# Patient Record
Sex: Male | Born: 1985 | Race: Asian | Hispanic: No
Health system: Western US, Academic
[De-identification: ages and names within clinical notes are randomized; demographics above are authoritative.]

## PROBLEM LIST (undated history)

## (undated) HISTORY — PX: PR RENAL ALTRNSPLJ IMPLTJ GRF W/O RCP NEPHRECTOMY: 50360

## (undated) MED ORDER — MYCOPHENOLATE MOFETIL 500 MG OR TABS
ORAL_TABLET | ORAL | 2 refills | Status: AC
Start: 2017-02-17 — End: ?

## (undated) MED ORDER — LISINOPRIL 5 MG OR TABS
ORAL_TABLET | ORAL | 0 refills | Status: AC
Start: 2016-02-26 — End: ?

---

## 2012-11-11 DIAGNOSIS — Z940 Kidney transplant status: Secondary | ICD-10-CM | POA: Insufficient documentation

## 2012-11-11 HISTORY — DX: Kidney transplant status: Z94.0

## 2012-11-15 DIAGNOSIS — I10 Essential (primary) hypertension: Secondary | ICD-10-CM

## 2012-11-15 HISTORY — DX: Essential (primary) hypertension: I10

## 2013-02-18 DIAGNOSIS — D849 Immunodeficiency, unspecified: Secondary | ICD-10-CM | POA: Insufficient documentation

## 2013-04-27 ENCOUNTER — Other Ambulatory Visit (HOSPITAL_COMMUNITY): Payer: Self-pay

## 2013-07-21 DIAGNOSIS — B002 Herpesviral gingivostomatitis and pharyngotonsillitis: Secondary | ICD-10-CM | POA: Insufficient documentation

## 2013-08-27 ENCOUNTER — Telehealth (HOSPITAL_BASED_OUTPATIENT_CLINIC_OR_DEPARTMENT_OTHER): Payer: Self-pay

## 2013-08-27 NOTE — Telephone Encounter (Signed)
CONFIRMED PHONE NUMBER: [phone]  CALLERS FIRST AND LAST NAME: Ernst BreachStephanie Larson  FACILITY NAME: [facility] TITLE: Procedure Scheduler Nurse  CALLERS RELATIONSHIP:OTHER: referring clinic  RETURN CALL: Detailed message on voicemail only     SUBJECT: General Message   REASON FOR REQUEST: Referral    MESSAGE: Judeth CornfieldStephanie is calling because they sent over a referral on 7/23 and wanted to get a status update on that. Please call them back, thank you.

## 2013-08-31 NOTE — Telephone Encounter (Signed)
Judeth CornfieldStephanie calling again regarding scheduling, per [facility] [place] He needs to be seen asap  Thanks call her [phone]

## 2013-09-10 ENCOUNTER — Other Ambulatory Visit (HOSPITAL_BASED_OUTPATIENT_CLINIC_OR_DEPARTMENT_OTHER): Payer: Self-pay | Admitting: Nephrology

## 2013-09-10 DIAGNOSIS — Z940 Kidney transplant status: Secondary | ICD-10-CM

## 2013-09-22 ENCOUNTER — Encounter (HOSPITAL_BASED_OUTPATIENT_CLINIC_OR_DEPARTMENT_OTHER): Payer: Self-pay | Admitting: Nephrology

## 2013-09-22 ENCOUNTER — Ambulatory Visit: Payer: 59 | Attending: Nephrology | Admitting: Nephrology

## 2013-09-22 ENCOUNTER — Other Ambulatory Visit (HOSPITAL_BASED_OUTPATIENT_CLINIC_OR_DEPARTMENT_OTHER): Payer: Self-pay | Admitting: Nephrology

## 2013-09-22 VITALS — BP 136/89 | HR 80 | Temp 97.9°F | Ht 66.0 in | Wt 136.0 lb

## 2013-09-22 DIAGNOSIS — R7989 Other specified abnormal findings of blood chemistry: Secondary | ICD-10-CM | POA: Insufficient documentation

## 2013-09-22 DIAGNOSIS — Z940 Kidney transplant status: Secondary | ICD-10-CM | POA: Insufficient documentation

## 2013-09-22 DIAGNOSIS — N185 Chronic kidney disease, stage 5: Secondary | ICD-10-CM

## 2013-09-22 DIAGNOSIS — R768 Other specified abnormal immunological findings in serum: Secondary | ICD-10-CM | POA: Insufficient documentation

## 2013-09-22 DIAGNOSIS — Z48298 Encounter for aftercare following other organ transplant: Secondary | ICD-10-CM | POA: Insufficient documentation

## 2013-09-22 DIAGNOSIS — R911 Solitary pulmonary nodule: Secondary | ICD-10-CM | POA: Insufficient documentation

## 2013-09-22 HISTORY — DX: Chronic kidney disease, stage 5: N18.5

## 2013-09-22 LAB — CBC, DIFF
% Basophils: 0 %
% Eosinophils: 2 %
% Immature Granulocytes: 1 %
% Lymphocytes: 19 %
% Monocytes: 7 %
% Neutrophils: 71 %
Absolute Eosinophil Count: 0.1 10*3/uL (ref 0.00–0.50)
Absolute Lymphocyte Count: 1.14 10*3/uL (ref 1.00–4.80)
Basophils: 0.01 10*3/uL (ref 0.00–0.20)
Hematocrit: 40 % (ref 38–50)
Hemoglobin: 13.2 g/dL (ref 13.0–18.0)
Immature Granulocytes: 0.03 10*3/uL (ref 0.00–0.05)
MCH: 29.2 pg (ref 27.3–33.6)
MCHC: 33.3 g/dL (ref 32.2–36.5)
MCV: 88 fL (ref 81–98)
Monocytes: 0.43 10*3/uL (ref 0.00–0.80)
Neutrophils: 4.23 10*3/uL (ref 1.80–7.00)
Platelet Count: 206 10*3/uL (ref 150–400)
RBC: 4.52 mil/uL (ref 4.40–5.60)
RDW-CV: 12.8 % (ref 11.6–14.4)
WBC: 5.94 10*3/uL (ref 4.3–10.0)

## 2013-09-22 LAB — LIPID PANEL
Cholesterol (LDL): 91 mg/dL (ref <130)
Cholesterol/HDL Ratio: 4.6
HDL Cholesterol: 37 mg/dL — ABNORMAL LOW (ref >39)
Non-HDL Cholesterol: 134 mg/dL (ref 0–159)
Total Cholesterol: 171 mg/dL (ref <200)
Triglyceride: 214 mg/dL — ABNORMAL HIGH (ref <150)

## 2013-09-22 LAB — URINALYSIS COMPLETE, URN
Bacteria, URN: NONE SEEN
Bilirubin (Qual), URN: NEGATIVE
Epith Cells_Renal/Trans,URN: NEGATIVE /HPF
Epith Cells_Squamous, URN: NEGATIVE /LPF
Glucose Qual, URN: NEGATIVE mg/dL
Ketones, URN: NEGATIVE mg/dL
Leukocyte Esterase, URN: NEGATIVE
Nitrite, URN: NEGATIVE
Occult Blood, URN: NEGATIVE
Protein (Alb Semiquant), URN: NEGATIVE mg/dL
RBC, URN: NEGATIVE /HPF
Specific Gravity, URN: 1.012 g/mL (ref 1.002–1.027)
WBC, URN: NEGATIVE /HPF

## 2013-09-22 LAB — CYCLOSPORINE BY LCMSMS: Cyclosporine By Lc_Ms: 101 ng/mL

## 2013-09-22 LAB — COMPREHENSIVE METABOLIC PANEL
ALT (GPT): 23 U/L (ref 10–64)
AST (GOT): 21 U/L (ref 9–38)
Albumin: 4.7 g/dL (ref 3.5–5.2)
Alkaline Phosphatase (Total): 95 U/L (ref 35–109)
Anion Gap: 7 (ref 4–12)
Bilirubin (Total): 1 mg/dL (ref 0.2–1.3)
Calcium: 9.5 mg/dL (ref 8.9–10.2)
Carbon Dioxide, Total: 26 mEq/L (ref 22–32)
Chloride: 101 mEq/L (ref 98–108)
Creatinine: 1.62 mg/dL — ABNORMAL HIGH (ref 0.51–1.18)
GFR, Calc, African American: >60 mL/min (ref >59)
GFR, Calc, European American: 52 mL/min — ABNORMAL LOW (ref >59)
Glucose: 108 mg/dL (ref 62–125)
Potassium: 3.7 mEq/L (ref 3.6–5.2)
Protein (Total): 7.5 g/dL (ref 6.0–8.2)
Sodium: 134 mEq/L — ABNORMAL LOW (ref 135–145)
Urea Nitrogen: 23 mg/dL — ABNORMAL HIGH (ref 8–21)

## 2013-09-22 LAB — PROTEIN/CREATININE RATIO, TIMED URINE
Creatinine/Unit, Urine: 108 mg/dL
Protein (Total), Urine: 9 mg/dL
Protein/Creatinine Ratio: <0.1 (ref <0.2)

## 2013-09-22 LAB — MAGNESIUM: Magnesium: 1.9 mg/dL (ref 1.8–2.4)

## 2013-09-22 LAB — MYCOPHENOLIC ACID: Mycophenolic Acid: 1.9 ug/mL (ref 1.0–3.5)

## 2013-09-22 LAB — PHOSPHATE: Phosphate: 3.4 mg/dL (ref 2.5–4.5)

## 2013-09-22 LAB — PARATHYROID HORMONE: Parathyroid Hormone: 25 pg/mL (ref 12–88)

## 2013-09-22 MED ORDER — MAGNESIUM OXIDE -MG SUPPLEMENT 400 (240 MG) MG OR TABS
400.0000 mg | ORAL_TABLET | Freq: Two times a day (BID) | ORAL | Status: DC
Start: 2013-09-22 — End: 2013-12-22

## 2013-09-22 MED ORDER — MYCOPHENOLATE MOFETIL 500 MG OR TABS
1000.0000 mg | ORAL_TABLET | Freq: Two times a day (BID) | ORAL | Status: DC
Start: 2013-09-22 — End: 2013-12-21

## 2013-09-22 MED ORDER — FERROUS SULFATE 324 (65 FE) MG OR TBEC
324.0000 mg | DELAYED_RELEASE_TABLET | Freq: Every day | ORAL | Status: DC
Start: 2013-09-22 — End: 2014-03-01

## 2013-09-22 MED ORDER — VITAMIN D (ERGOCALCIFEROL) 1.25 MG (50000 UT) OR CAPS
50000.0000 [IU] | ORAL_CAPSULE | ORAL | Status: DC
Start: 2013-09-22 — End: 2013-11-03

## 2013-09-22 MED ORDER — CYCLOSPORINE 25 MG OR CAPS
100.0000 mg | ORAL_CAPSULE | Freq: Two times a day (BID) | ORAL | Status: DC
Start: 2013-09-22 — End: 2013-12-21

## 2013-09-22 NOTE — Progress Notes (Signed)
Transplant Nephrology Outpatient Initial visit  Encounter Date: 09/22/2013  ACOMMPANIED BY: This patient is accompanied in the office by his sister.     Chief Complaint: 28 year old man with history of LRT 11/11/12, here to transfer care from [place].      History of Present Illness:  Mr. Herbert FreesMohammed is a 28 year old man with ESRD of unclear etiology, now with kidney transplant, here to establish care with a transplant center.    At about the age of 28 or [age], when he first came to [place], he was seen by a doctor for headaches.  He was eventually found to have blood pressures 240s/120s and was admitted for blood pressure control.  He had chronic kidney disease at that time which progressed over years to CKD stage V.  He does not have a primary diagnosis for his kidney disease - serologic workup was negative.  He underwent living related kidney transplant from his brother at [facility] [place] [facility] 11, 2012.    He underwent induction with ATG 5.4mg /kg and was started on an FK/MMF regimen.  An early biopsy 11/27/13 for elevated creatinine did not show any evidence of rejection.  He was eventually changed to cyclosporine / MMF at his request due to hair loss attributed to tacrolimus.  He has continued on this regimen with fluctuating creatinine - sometimes as high as 1.7 or 1.8.  He has not been biopsied since.  He has not had any proteinuria or cells in the urine.  He thinks his baseline creatinine is 1.4-1.6, confirmed by his nephrologist.  His brother is doing well, and he has a creatinine of 1.3.    He had one episode in February of fever without origin.  This required hospitalization.  He recovered.  The etiology remains unclear.    He has also had some difficulties with trouble sleeping and anxiety / bad dreams when falling asleep.  His family is further concerned about a volatile temper, which was not present prior to the transplant.  They wonder if his medications are to blame.     Review of Systems -      Otherwise negative except for noted in the HPI.      Past Medical History  I personally reviewed and confirmed the ROS, past medical history, past surgical history, family history and social history in the record with the patient today.    Patient Active Problem List   Diagnosis   . Immunosuppression   . 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 Herpetic gingivostomatitis   . CMV donor positive, recipient positive   . Lung nodule seen on imaging study        Past Medical History   Diagnosis Date   . Chronic kidney disease, stage V 09/22/2013     CKD and severe hypertension since 2006, unknown etiology. Preemptive kidney transplant 2014    . Hypertension 11/15/2012   . 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 ATG induction 5.4mg /kg (300mg ) Biopsy for increasing creatinine 11/27/12, no rejection. Baseline creatinine 1.4         Past Surgical History   Procedure Laterality Date   . Renal altrnsplj impltj grf w/o rcp nephrectomy          Review of patient's allergies indicates:  Allergies   Allergen Reactions   . Sulfa Antibiotics      Other reaction(s): Unknown Reaction        Current Outpatient Prescriptions  Medication Sig Dispense Refill   . CycloSPORINE 25 MG Oral Cap Take 4 capsules (100 mg) by mouth 2 times a day. 240 capsule 5   . Dimethicone 2 % External Cream Apply 1 application topically 2 times a day.     . Ferrous Sulfate 324 (65 FE) MG Oral Tab EC Take 1 tablet (324 mg) by mouth daily. 30 tablet 5   . Magnesium Oxide 400 (240 MG) MG Oral Tab Take 1 tablet (400 mg) by mouth 2 times a day. 60 tablet 3   . Mycophenolate Mofetil 500 MG Oral Tab Take 2 tablets (1,000 mg) by mouth 2 times a day. 120 tablet 5   . Vitamin D, Ergocalciferol, 50000 UNITS Oral Cap Take 1 capsule (50,000 Units) by mouth every 2 weeks. 6 capsule 3     No current facility-administered medications for this visit.        Medication Notes:   I have reviewed the above list of  medicines with the patient and it is accurate.     Family History   Problem Relation Age of Onset   . Diabetes Father    . Heart Disease No Hx Of    . Cancer No Hx Of    . Renal Disease No Hx Of    . Diabetes Paternal Grandmother         History     Social History   . Marital Status: Single     Spouse Name: N/A     Number of Children: N/A   . Years of Education: N/A     Occupational History   . Not on file.     Social History Main Topics   . Smoking status: Not on file   . Smokeless tobacco: Not on file   . Alcohol Use: Not on file   . Drug Use: Not on file   . Sexual Activity: Not on file     Other Topics Concern   . Not on file     Social History Narrative    Moved from [place] to [place] in 2015 after completing his studies.  Lives with sister and brother.        09/22/2013  BP 136/89 mmHg  Pulse 80  Temp(Src) 97.9 F (36.6 C) (Temporal)  Ht 5\' 6"  (1.676 m)  Wt 136 lb 0.4 oz (61.7 kg)  BMI 21.97 kg/m2  SpO2 99%  Estimated body mass index is 21.97 kg/(m^2) as calculated from the following:    Height as of this encounter: 5\' 6"  (1.676 m).    Weight as of this encounter: 136 lb 0.4 oz (61.7 kg).     Physical Exam:   Vital signs as above.    GENERAL: well-appearing young man, in no distress  Head: really great head of hair  Eye: perrl, anicteric sclerae  ENT: mmm, no oral lesions  CV: rrr, no m/r/g, no edema, 2+ radial and DP pulses  PUlm: clear to auscultation bilaterally  abd: soft, nontender, nondistended, no masses  GU: right lower quadrant well-healed scar, nontender, no bruit  Skin: no rashes  Neuro: no tremor        RESULTS REVIEWED.  Orders Only on 09/22/13   1. PHOSPHATE   Result Value Ref Range    Phosphate 3.4 2.5 - 4.5 mg/dL   2. MAGNESIUM   Result Value Ref Range    Magnesium 1.9 1.8 - 2.4 mg/dL   3. COMPREHENSIVE METABOLIC PANEL   Result Value Ref Range  Sodium 134 (L) 135 - 145 mEq/L    Potassium 3.7 3.6 - 5.2 mEq/L    Chloride 101 98 - 108 mEq/L    Carbon Dioxide, Total 26 22 - 32 mEq/L     Anion Gap 7 4 - 12    Glucose 108 62 - 125 mg/dL    Urea Nitrogen 23 (H) 8 - 21 mg/dL    Creatinine 1.61 (H) 0.51 - 1.18 mg/dL    Protein (Total) 7.5 6.0 - 8.2 g/dL    Albumin 4.7 3.5 - 5.2 g/dL    Bilirubin (Total) 1.0 0.2 - 1.3 mg/dL    Calcium 9.5 8.9 - 09.6 mg/dL    AST (GOT) 21 9 - 38 U/L    Alkaline Phosphatase (Total) 95 35 - 109 U/L    ALT (GPT) 23 10 - 64 U/L    GFR, Calc, European American 52 (L) >59 mL/min    GFR, Calc, African American >60 >59 mL/min    GFR, Information       Calculated GFR in mL/min/1.73 m2 by MDRD equation.  Inaccurate with changing renal function.  See [email or web address]   4. CBC, DIFF   Result Value Ref Range    WBC 5.94 4.3 - 10.0 THOU/uL    RBC 4.52 4.40 - 5.60 mil/uL    Hemoglobin 13.2 13.0 - 18.0 g/dL    Hematocrit 40 38 - 50 %    MCV 88 81 - 98 fL    MCH 29.2 27.3 - 33.6 pg    MCHC 33.3 32.2 - 36.5 g/dL    Platelet Count 045 409 - 400 THOU/uL    RDW-CV 12.8 11.6 - 14.4 %    % Neutrophils 71 %    % Lymphocytes 19 %    % Monocytes 7 %    % Eosinophils 2 %    % Basophils 0 %    % Immature Granulocytes 1 %    Neutrophils 4.23 1.80 - 7.00 THOU/uL    Absolute Lymphocyte Count 1.14 1.00 - 4.80 THOU/uL    Monocytes 0.43 0.00 - 0.80 THOU/uL    Absolute Eosinophil Count 0.10 0.00 - 0.50 THOU/uL    Basophils 0.01 0.00 - 0.20 THOU/uL    Immature Granulocytes 0.03 0.00 - 0.05 THOU/uL   5. Urinalysis Complete, URN   Result Value Ref Range    Color, URN Yellow     Clarity, URN Clear     Specific Gravity, URN 1.012 1.002 - 1.027 g/mL    pH, URN < or =5.0 5.0 - 8.0    Protein (Alb Semiquant), URN Negative NRN mg/dL    Glucose Qual, URN Negative NRN mg/dL    Ketones, URN Negative NRN mg/dL    Bilirubin (Qual), URN Negative NRN    Occult Blood, URN Negative NRN    Nitrite, URN Negative NRN    Leukocyte Esterase, URN Negative NRN    Urobilinogen, URN 0.1-1.9 URONML EHRLICH UNITS    Comments for Macroscopic, URN None     WBC, URN 0-5(NEG) Z5NEG /HPF    RBC, URN  0-2(NEG) Z2NEG /HPF    Bacteria, URN Not Seen NOSEEN    Epith Cells_Squamous, URN 0-5(NEG) LT6 /LPF    Epith Cells_Renal/Trans,URN <3(NEG) LESS3 /HPF    Comments For Microscopic, URN None NONE   6. URINE PROTEIN/CREATININE RATIO   Result Value Ref Range    Protein/Creat Ratio Int, URN Random hr    Protein/Creat Ratio Volume,URN Random mL    Protein (Total), URN 9  mg/dL    Total ZOXWRUE/45W, URN Not calculated 0.050 - 0.080 g/24 hr    Creatinine/Unit, URN 108 mg/dL    UJWJXBJYNW/29F, URN Not calculated 1000 - 2000 mg/24 hr    Protein/Creatinine Ratio <0.1 <0.2   7. LIPID PANEL   Result Value Ref Range    Cholesterol (Total) 171 <200 mg/dL    Triglyceride 621 (H) <150 mg/dL    Cholesterol (HDL) 37 (L) >39 mg/dL    Cholesterol (LDL) 91 <130 mg/dL    Non-HDL Cholesterol 134 0 - 159 mg/dL    Cholesterol/HDL Ratio 4.6     Lipid Panel, Additional Info. (NOTE)    8. MYCOPHENOLIC ACID   Result Value Ref Range    Mycophenolic Acid 1.9 1.0 - 3.5 mcg/mL   9. PARATHYROID HORMONE   Result Value Ref Range    Parathyroid Hormone 25 12 - 88 pg/mL   10. CYCLOSPORINE BY LCMSMS   Result Value Ref Range    Cyclosporine By Lc_Ms 101 ng/mL    Cyclosporine Information Whole blood Cyclosporine was measured by LCMSMS.          Impression and Plan:  Herbert Robertson is a 28 year old man with ESRD of unknown etiology, now s/p LRT 11/11/12 from his brother.      He presents today for transfer of care.  Creatinine is 1.6 without proteinuria, which appears to be his recent baseline (with a fair amount of fluctuation).    He is maintained on a steroid-free regimen of cyclosporine / MMF.    I am concerned about his level of immunosuppression, and wonder whether other options (tacrolimus, sirolimus, belatacept) might be better as a cornerstone of treatment rather than cyclosporine.  I do not know the degree of matching between him and his brother, though I do note that relatively high induction immunosuppression was used.  We will try to find out the  degree of haplotype match, which might influence the amount of immunosuppression.    Given his elevated creatinine and current regimen, I favor a surveillance biopsy at around one-year post-transplant and then a re-evaluation of his long-term immunosuppression plan.    1. Renal allograft function  - baseline creatinine 1.5-1.6 without proteinuria    2. Immunosuppression.  ATG 5.6mg /kg induction.  Steroid-free regimen.  - increase cyclosporine 75mg  bid to 100mg  bid  - continue MMF 1g bid  - off tacrolimus (hair loss)    3. HTN: resolved  - on no medications    4. Hypomagnesemia  - continue supplementation    Labs 2 weeks given CSA change.  RTC one month, plan for biopsy at that time  Will refer to outside nephrologist on the [facility] after biopsy and immunosuppression plan determined.

## 2013-09-23 ENCOUNTER — Other Ambulatory Visit (HOSPITAL_BASED_OUTPATIENT_CLINIC_OR_DEPARTMENT_OTHER): Payer: Self-pay

## 2013-09-23 DIAGNOSIS — Z940 Kidney transplant status: Secondary | ICD-10-CM

## 2013-09-24 LAB — VITAMIN D (25 HYDROXY)
Vit D (25_Hydroxy) Total: 42.2 ng/mL (ref 20.1–50.0)
Vitamin D2 (25_Hydroxy): 33.7 ng/mL
Vitamin D3 (25_Hydroxy): 8.5 ng/mL

## 2013-09-24 LAB — BK VIRUS PCR QUANT, URINE: BK Virus Urine Quant: NOT DETECTED Copies/mL

## 2013-10-08 ENCOUNTER — Other Ambulatory Visit (HOSPITAL_COMMUNITY): Payer: Self-pay

## 2013-10-08 LAB — UA MACROSCOPIC W/REFLEX CULTURE, EXTERNAL
Bilirubin Qual, URN, External: NEGATIVE
Glucose Qual, URN, External: NEGATIVE
Ketones, URN, External: NEGATIVE
Leukocyte Esterase, URN, External: NEGATIVE
Nitrite, URN, External: NEGATIVE
Occult Blood, URN, External: NEGATIVE
Protein (Alb Semi Quant), URN, External: NEGATIVE
Specific Gravity, URN, External: 1.013
Urobilinogen, URN, External: 0.2
pH, URN, External: 6

## 2013-10-08 LAB — HEPATIC FUNCTION PANEL, EXTERNAL
ALT (GPT), External: 10
AST (GOT), External: 17
Albumin, External: 4.7
Alkaline Phosphatase (Total), External: 119
Bilirubin (Total), External: 0.5
Protein (Total), External: 7.1

## 2013-10-08 LAB — CBC, DIFF, EXTERNAL
% Basophils, External: 0
% Eosinophils, External: 1
% Lymphocytes, External: 29
% Monocytes, External: 8
% Neutrophils, External: 62
Basophils, External: 0
Eosinophil Count, External: 0.1
Lymphocyte Count, External: 2
Monocytes, External: 0.5
Neutrophils, External: 4.1

## 2013-10-08 LAB — CBC (HEMOGRAM), EXTERNAL
Hematocrit, External: 41.4
Hemoglobin, External: 13.5
MCH, External: 28.9
MCV, External: 89
Platelet Count, External: 203
WBC, External: 6.8

## 2013-10-08 LAB — UA MICROSCOPIC W/REFLEX CULTURE, EXTERNAL: RBC, URN, External: NONE SEEN

## 2013-10-08 LAB — PROTEIN/CREATININE RATIO, URN, EXTERNAL
Creatinine/Unit, URN, External: 85.3
Protein (Total), URN, External: 5.9
Protein/Creatinine Ratio, External: 69

## 2013-10-08 LAB — BASIC METABOLIC PANEL, EXTERNAL
Calcium, External: 9.5
Carbon Dioxide (Total), External: 19
Chloride, External: 99
Creatinine, External: 1.74
Glucose, External: 105
Potassium, External: 4.5
Sodium, External: 139
Urea Nitrogen, External: 22

## 2013-10-08 LAB — PHOSPHATE, EXTERNAL: Phosphate, External: 5.2

## 2013-10-08 LAB — CYCLOSPORINE, EXTERNAL: Cyclosporine By LC_MS, External: 153

## 2013-10-08 LAB — COMPREHENSIVE METABOLIC PANEL, EXTERNAL: GFR, Calc, European American, External: 53

## 2013-10-08 LAB — MAGNESIUM, EXTERNAL: Magnesium, External: 2

## 2013-10-09 ENCOUNTER — Other Ambulatory Visit: Payer: Self-pay

## 2013-10-20 ENCOUNTER — Encounter (HOSPITAL_BASED_OUTPATIENT_CLINIC_OR_DEPARTMENT_OTHER): Payer: 59 | Admitting: Nephrology

## 2013-11-03 ENCOUNTER — Encounter (HOSPITAL_BASED_OUTPATIENT_CLINIC_OR_DEPARTMENT_OTHER): Payer: Self-pay | Admitting: Nephrology

## 2013-11-03 ENCOUNTER — Ambulatory Visit: Payer: 59 | Attending: Nephrology | Admitting: Nephrology

## 2013-11-03 ENCOUNTER — Other Ambulatory Visit (HOSPITAL_BASED_OUTPATIENT_CLINIC_OR_DEPARTMENT_OTHER): Payer: Self-pay

## 2013-11-03 ENCOUNTER — Other Ambulatory Visit (HOSPITAL_BASED_OUTPATIENT_CLINIC_OR_DEPARTMENT_OTHER): Payer: Self-pay | Admitting: Nephrology

## 2013-11-03 ENCOUNTER — Other Ambulatory Visit (HOSPITAL_COMMUNITY): Payer: Self-pay

## 2013-11-03 VITALS — BP 140/93 | HR 78 | Temp 98.1°F | Ht 66.0 in | Wt 139.6 lb

## 2013-11-03 DIAGNOSIS — Z940 Kidney transplant status: Secondary | ICD-10-CM

## 2013-11-03 DIAGNOSIS — D849 Immunodeficiency, unspecified: Secondary | ICD-10-CM

## 2013-11-03 DIAGNOSIS — Z23 Encounter for immunization: Secondary | ICD-10-CM

## 2013-11-03 DIAGNOSIS — D899 Disorder involving the immune mechanism, unspecified: Secondary | ICD-10-CM | POA: Insufficient documentation

## 2013-11-03 DIAGNOSIS — I10 Essential (primary) hypertension: Secondary | ICD-10-CM | POA: Insufficient documentation

## 2013-11-03 LAB — COMPREHENSIVE METABOLIC PANEL
ALT (GPT): 17 U/L (ref 10–64)
AST (GOT): 20 U/L (ref 9–38)
Albumin: 4.5 g/dL (ref 3.5–5.2)
Alkaline Phosphatase (Total): 93 U/L (ref 35–109)
Anion Gap: 8 (ref 4–12)
Bilirubin (Total): 0.6 mg/dL (ref 0.2–1.3)
Calcium: 9.5 mg/dL (ref 8.9–10.2)
Carbon Dioxide, Total: 24 mEq/L (ref 22–32)
Chloride: 103 mEq/L (ref 98–108)
Creatinine: 1.64 mg/dL — ABNORMAL HIGH (ref 0.51–1.18)
GFR, Calc, African American: >60 mL/min (ref >59)
GFR, Calc, European American: 51 mL/min — ABNORMAL LOW (ref >59)
Glucose: 107 mg/dL (ref 62–125)
Potassium: 4 mEq/L (ref 3.6–5.2)
Protein (Total): 7.4 g/dL (ref 6.0–8.2)
Sodium: 135 mEq/L (ref 135–145)
Urea Nitrogen: 19 mg/dL (ref 8–21)

## 2013-11-03 LAB — CBC, DIFF
% Basophils: 0 %
% Eosinophils: 2 %
% Immature Granulocytes: 0 %
% Lymphocytes: 24 %
% Monocytes: 8 %
% Neutrophils: 66 %
Absolute Eosinophil Count: 0.09 10*3/uL (ref 0.00–0.50)
Absolute Lymphocyte Count: 1.34 10*3/uL (ref 1.00–4.80)
Basophils: 0.02 10*3/uL (ref 0.00–0.20)
Hematocrit: 39 % (ref 38–50)
Hemoglobin: 13.4 g/dL (ref 13.0–18.0)
Immature Granulocytes: 0.01 10*3/uL (ref 0.00–0.05)
MCH: 28.8 pg (ref 27.3–33.6)
MCHC: 34.6 g/dL (ref 32.2–36.5)
MCV: 83 fL (ref 81–98)
Monocytes: 0.43 10*3/uL (ref 0.00–0.80)
Neutrophils: 3.76 10*3/uL (ref 1.80–7.00)
Platelet Count: 172 10*3/uL (ref 150–400)
RBC: 4.66 mil/uL (ref 4.40–5.60)
RDW-CV: 12.3 % (ref 11.6–14.4)
WBC: 5.65 10*3/uL (ref 4.3–10.0)

## 2013-11-03 LAB — URINALYSIS COMPLETE, URN
Bacteria, URN: NONE SEEN
Bilirubin (Qual), URN: NEGATIVE
Epith Cells_Renal/Trans,URN: NEGATIVE /HPF
Epith Cells_Squamous, URN: NEGATIVE /LPF
Glucose Qual, URN: NEGATIVE mg/dL
Ketones, URN: NEGATIVE mg/dL
Leukocyte Esterase, URN: NEGATIVE
Nitrite, URN: NEGATIVE
Occult Blood, URN: NEGATIVE
Protein (Alb Semiquant), URN: NEGATIVE mg/dL
RBC, URN: NEGATIVE /HPF
Specific Gravity, URN: 1.01 g/mL (ref 1.002–1.027)
WBC, URN: NEGATIVE /HPF

## 2013-11-03 LAB — MYCOPHENOLIC ACID: Mycophenolic Acid: 1.9 ug/mL (ref 1.0–3.5)

## 2013-11-03 LAB — LAB UNDEFINED ORCA/EPIC ORDER

## 2013-11-03 LAB — PROTEIN/CREATININE RATIO, TIMED URINE
Creatinine/Unit, Urine: 88 mg/dL
Protein (Total), Urine: 11 mg/dL
Protein/Creatinine Ratio: 0.1 (ref <0.2)

## 2013-11-03 LAB — CYCLOSPORINE BY LCMSMS: Cyclosporine By Lc_Ms: 129 ng/mL

## 2013-11-03 LAB — MAGNESIUM: Magnesium: 1.8 mg/dL (ref 1.8–2.4)

## 2013-11-03 LAB — PHOSPHATE: Phosphate: 3.9 mg/dL (ref 2.5–4.5)

## 2013-11-03 NOTE — Progress Notes (Signed)
Vaccine Screening Questions: [facility]        1. I feel sick today.   NO    1. There is no evidence that acute illness reduces vaccine efficacy or increases vaccine adverse events. However, with moderate or severe illness, all vaccines should be delayed until the illness has improved. Mild illness (e.g., upperrespiratory infection or diarrhea) are NOT contraindications to vaccination. Do not withhold vaccination if a person is taking antibiotic.    2. Are you allergic to eggs, baker's yeast, gelatin, streptomycin, neomycin, formaldehyde,merthiolate / thimerosal? (Please circle all that apply)  NO    2. Please see Vaccine Excipient Table below at end of form.**    3. Are you currently taking "blood thinning medications" such as Warfarin (Coumadin), Enoxaparin (Lovenox, Dalteparin(Fragmin), Fondiparinux(Arixtra), Aspirin, Clopidogrel(Plavix), Prasugrel(Effient), Dipyridamol(Persantine), Ticagrelor(Brilinta), Cilostazol(Pletal), Aggrenox, Rivaroxaban(Xarelto), Dabigatran(Xarelto)  NO    3.  Patients who are currently receiving these medications need to advise their providers so that we may take specific precautions when giving intramuscular vaccines. Administer using a 23-gauge (or smaller) needle and apply pressure to  the vaccine site for 2 minutes after administration. DO NOT RUB the vaccine site. Patients should be counseled on monitoring for signs and symptoms of hematoma formation at the site of the injection.    4. Do you have allergies to medications, food, or any vaccine?   If yes, please list:@ALLERGIES@  YES:  sulfa    4. History of anaphylactic reaction such as hives, wheezing or difficulty breathing, or circulatory collapse or shock (not fainting) from a previous dose of vaccine or vaccine component is a contraindication for further doses. For example, if a person  experiences anaphylaxis after eating eggs, do not administer influenza vaccine, or if a person has anaphylaxis  after eating gelatin, do not administer MMR or varicella vaccine. Local reactions (e.g., a red eye following instillation of ophthalmic solution) are not contraindications. Please see Vaccine Excipient Table enclosed on order form.     5. Have you ever had a severe reaction to any vaccine? If yes, please describe:  NO    5.  History of anaphylactic reaction to a previous dose of vaccine or vaccine component is a contraindication for subsequent  doses. Under normal circumstances, vaccines are deferred when a precaution is present. However, situations may arise  when the benefit outweighs the risk (e.g., community measles outbreak).    6. Are you currently undergoing radiation treatments?  NO    6.  If patient is undergoing radiation therapy, inactivated vaccine is no problem. For live virus vaccines contact patient's PCP prior to administration.    7. Have you had Immune Globulin (Gamma Globulin) or received blood or blood products in the past seven months?  NO    7. Live virus vaccines may need to be deferred, depending on several variables. Please see the ACIP Statement "General Recommendations on Immunization" on page 1 of the PINK BOOK and APPENDIX A for suggested intervals between administration of blood products and live virus vaccines.    8. Have you ever had Guillian-Barre Syndrome, a condition that causes paralysis?  NO    8. If the patient has had this condition, hold the vaccination and call the MD.    9.  Are you pregnant or planning a pregnancy in the next three months?  NO    9. Live virus vaccines are contraindicated prior to and during pregnancy due to the possible risk of virus transmission to the fetus. Instruct patients who are   sexually active in their childbearing years to practice careful contraception for one month  after receiving the MMR, rubella, varicella, or intranasal influenza vaccination.    10. Have you received any vaccinations in the past 4 weeks?  NO    10. You may either administer  two live vaccines on the same day OR separate them by 28 days. Live vaccines must replicate in order for the immune system to mount a response. Separating live vaccine administrations allows for less interference of  viral replication. On the other hand, inactivated vaccines may be given together or at any spacing interval.    11. Are you allergic to latex products? (If yes, please describe reaction):  NO    11. If the patient has a true latex allergy, call the inpatient pharmacy ([phone]) and they will provide a latex-free immunization.          Adapted from "Understanding the Screening Questionnaire for Adult Immunization", Immunization [employer], www.immunize.org.   Updated June 2014    **VACCINE EXCIPIENT AND MEDIA SUMMARY  Excipient Use Vaccine    Egg protein: Influenza (all brands)  Formaldehyde, formalin:  DTaP (all brands), Td (all brands), Hepatitis A (Havrix, Vaqta), Hepatitis A-hepatitis B (Twinrix), Influenza (Fluogen, FluShield, Fluzone)  Gelatin: Influenza (Fluzone), Measles (Attenuvax), Mumps (Mumpsvax),  Rubella (Meruvax II), MMR (MMRII), Typhoid oral (Vivotif), Varicella (Varivax)  Neomycin:  Measles (Attenuvax), Mumps (Mumpsvax), Rubella (Meruvax II),  MMR (MMR-II), Rabies (Imovax), Influenza (Fluvirin)  Phenol: Pneumococcal polysaccharide (Pneumovax 23)  Streptomycin: Influenza (Fluogen)  Thimerosal: Influenza (all brands except the intranasal vaccine), Pneumococcal  polysaccharide (Pneu-Immune 23), Meningococcal (Menomune)  Yeast: Hepatitis B (Engerix-B, Recombivax-HB)    Adapted from "Vaccine Excipient & Media Summary", ImmunoFacts, Facts and Comparisons,  February 2001 and Offit PA, Jew RK. Addressing parents' concerns: do vaccines contain harmful  preservatives, adjuvants, additives, or residuals? Pediatrics.2003;112(6);1394-7.  Updated June 2014

## 2013-11-03 NOTE — Progress Notes (Signed)
Transplant Nephrology Outpatient Initial visit  Encounter Date: 11/03/2013  ACOMMPANIED BY: This patient is accompanied in the office by his sister.     Chief Complaint: 28 year old man with history of LRT 11/11/12 from his brother (Ht 5'4", Wt 170lbs), here for a second opinion to discuss management, including biopsy and immunosuppression.      History of Present Illness:  Mr. Mckone is a 28 year old man with ESRD of unclear etiology, now with kidney transplant. At about the age of 55 or 71, he was seen by a doctor for headaches.  He was found to have blood pressures 240s/120s and was admitted for blood pressure control.  He had chronic kidney disease at that time which progressed over years to CKD stage V.  He does not have a primary diagnosis for his kidney disease - serologic workup was negative.  He underwent living related kidney transplant from his brother at the [facility] November 11, 2012. He received ATG 5.11m/kg and was started on an FK/MMF regimen (steroid-avoidance).  An early biopsy 11/27/12 for elevated creatinine did not show any evidence of rejection, was complicated by bladder clots requiring temporary foley placement.  He was eventually changed to cyclosporine / MMF at his request due to hair loss attributed to tacrolimus.  He has continued on this regimen with fluctuating creatinine - sometimes as high as 1.7 or 1.8, and he thinks his baseline creatinine is 1.4-1.6, confirmed by his nephrologist. He has not been biopsied since.  He has not had any proteinuria or cells in the urine.  His brother is doing well, and he has a creatinine of 1.3.    He has no complaints today, is exercising 4 days/week, is sleeping better and eating high protein diet. He does have difficulty with weight loss. His sister notes that he is much more pleasant since he has stopped playing a shooting video game.    Review of Systems -   Otherwise negative except for noted in the HPI.      Past Medical History  I  personally reviewed and confirmed the ROS, past medical history, past surgical history, family history and social history in the record with the patient today.      Patient Active Problem List   Diagnosis   . Immunosuppression   . 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 Herpetic gingivostomatitis   . CMV donor positive, recipient positive   . Lung nodule seen on imaging study        Past Medical History   Diagnosis Date   . Chronic kidney disease, stage V 09/22/2013     CKD and severe hypertension since 2006, unknown etiology. Preemptive kidney transplant 2014    . Hypertension 11/15/2012   . 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 ATG induction 5.461mkg (30058mBiopsy for increasing creatinine 11/27/12, no rejection. Baseline creatinine 1.4         Past Surgical History   Procedure Laterality Date   . Renal altrnsplj impltj grf w/o rcp nephrectomy          Review of patient's allergies indicates:  Allergies   Allergen Reactions   . Sulfa Antibiotics      Other reaction(s): Unknown Reaction        Current Outpatient Prescriptions   Medication Sig Dispense Refill   . CycloSPORINE 25 MG Oral Cap Take 4 capsules (100 mg) by mouth 2 times a day. 240 capsule 5   .  Dimethicone 2 % External Cream Apply 1 application topically 2 times a day.     . Ferrous Sulfate 324 (65 FE) MG Oral Tab EC Take 1 tablet (324 mg) by mouth daily. 30 tablet 5   . Magnesium Oxide 400 (240 MG) MG Oral Tab Take 1 tablet (400 mg) by mouth 2 times a day. 60 tablet 3   . Mycophenolate Mofetil 500 MG Oral Tab Take 2 tablets (1,000 mg) by mouth 2 times a day. 120 tablet 5   . Vitamin D, Ergocalciferol, 50000 UNITS Oral Cap Take 1 capsule (50,000 Units) by mouth every 2 weeks. 6 capsule 3     No current facility-administered medications for this visit.        Medication Notes:   I have reviewed the above list of medicines with the patient and it is accurate.     Family History   Problem  Relation Age of Onset   . Diabetes Father    . Heart Disease No Hx Of    . Cancer No Hx Of    . Renal Disease No Hx Of    . Diabetes Paternal Grandmother         History     Social History   . Marital Status: Single     Spouse Name: N/A     Number of Children: N/A   . Years of Education: N/A     Occupational History   . Not on file.     Social History Main Topics   . Smoking status: Not on file   . Smokeless tobacco: Not on file   . Alcohol Use: Not on file   . Drug Use: Not on file   . Sexual Activity: Not on file     Other Topics Concern   . Not on file     Social History Narrative    Moved from [place] to [place] in 2015 after completing his studies.  Lives with sister and brother.        Physical Exam:   Filed Vitals:    11/03/13 0931 11/03/13 1103   BP: 147/95 140/93   Pulse: 78    Temp: 98.1 F (36.7 C)    TempSrc: Temporal    Height: 5' 6" (1.676 m)    Weight: 139 lb 8.8 oz (63.299 kg)    SpO2: 99%      Estimated body mass index is 22.53 kg/(m^2) as calculated from the following:    Height as of this encounter: 5' 6" (1.676 m).    Weight as of this encounter: 139 lb 8.8 oz (63.299 kg).   General: 28 year old male in no acute distress.  HEENT: Anicteric sclerae, moist mucosa without erythema or exudate.  NECK: Supple, without cervical or supraclavicular lymphadenopathy or jugular venous distention.   LUNGS: Clear to auscultation, without wheeze or rales.  Cardiovascular: Regular rate or rhthym, without murmur, gallop or rub.  Abdominal: Soft, Nontender, nondistended, normal active bowel sounds. Incision is well healed. Allograft is nontender in Right lower quadrant, and without bruit.  Extremities: No clubbing, cyanosis, or edema.  Neurologic: Grossly normal, without tremor. Alert & oriented x4.  Skin: Normal without rash.          RESULTS REVIEWED.  Orders Only on 11/03/13   1. DSA   Result Value Ref Range    Lab Test Requested DSA     Specimen Type/Description blood     Additional Test Information paper  requisition sent to   lab     Test Request Status Order Processed    Orders Only on 11/03/13   2. PHOSPHATE   Result Value Ref Range    Phosphate 3.9 2.5 - 4.5 mg/dL   3. COMPREHENSIVE METABOLIC PANEL   Result Value Ref Range    Sodium 135 135 - 145 mEq/L    Potassium 4.0 3.6 - 5.2 mEq/L    Chloride 103 98 - 108 mEq/L    Carbon Dioxide, Total 24 22 - 32 mEq/L    Anion Gap 8 4 - 12    Glucose 107 62 - 125 mg/dL    Urea Nitrogen 19 8 - 21 mg/dL    Creatinine 1.64 (H) 0.51 - 1.18 mg/dL    Protein (Total) 7.4 6.0 - 8.2 g/dL    Albumin 4.5 3.5 - 5.2 g/dL    Bilirubin (Total) 0.6 0.2 - 1.3 mg/dL    Calcium 9.5 8.9 - 10.2 mg/dL    AST (GOT) 20 9 - 38 U/L    Alkaline Phosphatase (Total) 93 35 - 109 U/L    ALT (GPT) 17 10 - 64 U/L    GFR, Calc, European American 51 (L) >59 mL/min    GFR, Calc, African American >60 >59 mL/min    GFR, Information       Calculated GFR in mL/min/1.73 m2 by MDRD equation.  Inaccurate with changing renal function.  See [email or web address]   4. MAGNESIUM   Result Value Ref Range    Magnesium 1.8 1.8 - 2.4 mg/dL   5. CBC, DIFF   Result Value Ref Range    WBC 5.65 4.3 - 10.0 THOU/uL    RBC 4.66 4.40 - 5.60 mil/uL    Hemoglobin 13.4 13.0 - 18.0 g/dL    Hematocrit 39 38 - 50 %    MCV 83 81 - 98 fL    MCH 28.8 27.3 - 33.6 pg    MCHC 34.6 32.2 - 36.5 g/dL    Platelet Count 172 150 - 400 THOU/uL    RDW-CV 12.3 11.6 - 14.4 %    % Neutrophils 66 %    % Lymphocytes 24 %    % Monocytes 8 %    % Eosinophils 2 %    % Basophils 0 %    % Immature Granulocytes 0 %    Neutrophils 3.76 1.80 - 7.00 THOU/uL    Absolute Lymphocyte Count 1.34 1.00 - 4.80 THOU/uL    Monocytes 0.43 0.00 - 0.80 THOU/uL    Absolute Eosinophil Count 0.09 0.00 - 0.50 THOU/uL    Basophils 0.02 0.00 - 0.20 THOU/uL    Immature Granulocytes 0.01 0.00 - 0.05 THOU/uL   6. Urinalysis Complete, URN   Result Value Ref Range    Color, URN Yellow     Clarity, URN Clear     Specific Gravity, URN 1.010 1.002 - 1.027 g/mL     pH, URN < or =5.0 5.0 - 8.0    Protein (Alb Semiquant), URN Negative NRN mg/dL    Glucose Qual, URN Negative NRN mg/dL    Ketones, URN Negative NRN mg/dL    Bilirubin (Qual), URN Negative NRN    Occult Blood, URN Negative NRN    Nitrite, URN Negative NRN    Leukocyte Esterase, URN Negative NRN    Urobilinogen, URN 6.1-6.0 URONML EHRLICH UNITS    Comments for Macroscopic, URN None     WBC, URN 0-5(NEG) Z5NEG /HPF    RBC, URN 0-2(NEG) Z2NEG /HPF  Bacteria, URN Not Seen NOSEEN    Epith Cells_Squamous, URN 0-5(NEG) LT6 /LPF    Epith Cells_Renal/Trans,URN <3(NEG) LESS3 /HPF    Comments For Microscopic, URN None NONE   7. URINE PROTEIN/CREATININE RATIO   Result Value Ref Range    Protein/Creat Ratio Int, URN Random hr    Protein/Creat Ratio Volume,URN Random mL    Protein (Total), URN  mg/dL    Total Protein/24H, URN  0.05 - 0.08 g/24 hr    Creatinine/Unit, URN  mg/dL    Creatinine/24H, URN  1000 - 2000 mg/24 hr    Protein/Creatinine Ratio  <0.2         Impression and Plan:  Mr. Rodger is a 28-year-old man with ESRD of unknown etiology, now s/p LRT 11/11/12 from his brother at [facility]    1. Renal allograft function. Baseline creatinine 1.5-1.6 without proteinuria;  - I will attempt to obtain his HLA and PRA prior to deciding on whether a biopsy is indicated. He is concerned about the need for a foley post-biopsy, as he required it because of clots and bladder dysfunction with the prior biopsy, which was early post-transplant on narcotics.    2. Immunosuppression.  ATG 5.6mg/kg induction.  Steroid-free regimen.  - Continuie cyclosporine 100mg bid, with goal 100-150ng/ml at this time until further data is available from transplant.  - continue MMF 1g bid  - off tacrolimus (hair loss)    3. HTN: reportedly normal at home; Will recheck today, advised more frequent exercise, healthy diet and regular sleep.  - on no medications    4. Hypomagnesemia. Normal on current dose.  - continue supplementation    5. Fe  deficiency anemia. Will continue on FeSO4 supplementation and follow CBC.    6. Health maintenance. Will provide influenza vaccination today. Advised attention to health, including cognitive engagement with memory games and books since he is concerned about his memory recall.    Labs q month.  RTC 2 months, with decision about biopsy to occur prior - at the time we receive records from [facility]. He may be moving away from the area in the next 3 months, so I would like to address the concern regarding indication for biopsy prior to his move.    Chris Blosser, MD  Department of Medicine - Division of Nephrology & Transplantation  [facility]  Pager [phone]

## 2013-11-04 LAB — URINE C/S W/GRAM
Colony Count: <100
Culture: NO GROWTH
Direct Exam: NONE SEEN

## 2013-11-05 LAB — BK VIRUS PCR QUANT, URINE: BK Virus Urine Quant: NOT DETECTED Copies/mL

## 2013-11-05 LAB — VITAMIN D (25 HYDROXY)
Vit D (25_Hydroxy) Total: 37.6 ng/mL (ref 20.1–50.0)
Vitamin D2 (25_Hydroxy): 20.4 ng/mL
Vitamin D3 (25_Hydroxy): 17.2 ng/mL

## 2013-11-07 LAB — HLA AB SPEC. MONITOR DSA (SENDOUT)

## 2013-12-03 ENCOUNTER — Other Ambulatory Visit (HOSPITAL_COMMUNITY): Payer: Self-pay

## 2013-12-03 LAB — BASIC METABOLIC PANEL, EXTERNAL
Calcium, External: 9.6
Carbon Dioxide (Total), External: 21
Chloride, External: 98
Creatinine, External: 1.49
Glucose, External: 98
Potassium, External: 4.3
Sodium, External: 136
Urea Nitrogen, External: 20

## 2013-12-03 LAB — CBC (HEMOGRAM), EXTERNAL
Hematocrit, External: 40.6
Hemoglobin, External: 13.7
MCH, External: 28.2
MCV, External: 84
Platelet Count, External: 214
WBC, External: 6.2

## 2013-12-03 LAB — MAGNESIUM, EXTERNAL: Magnesium, External: 1.8

## 2013-12-03 LAB — UA MICROSCOPIC W/REFLEX CULTURE, EXTERNAL: RBC, URN, External: NONE SEEN

## 2013-12-03 LAB — PROTEIN/CREATININE RATIO, URN, EXTERNAL
Creatinine/Unit, URN, External: 50
Protein (Total), URN, External: 4.5
Protein/Creatinine Ratio, External: 90

## 2013-12-03 LAB — CBC, DIFF, EXTERNAL
% Basophils, External: 0
% Eosinophils, External: 2
% Lymphocytes, External: 24
% Monocytes, External: 8
% Neutrophils, External: 66
Basophils, External: 0
Eosinophil Count, External: 0.1
Lymphocyte Count, External: 1.5
Monocytes, External: 0.5
Neutrophils, External: 4

## 2013-12-03 LAB — UA MACROSCOPIC W/REFLEX CULTURE, EXTERNAL
Bilirubin Qual, URN, External: NEGATIVE
Glucose Qual, URN, External: NEGATIVE
Ketones, URN, External: NEGATIVE
Leukocyte Esterase, URN, External: NEGATIVE
Nitrite, URN, External: NEGATIVE
Occult Blood, URN, External: NEGATIVE
Protein (Alb Semi Quant), URN, External: NEGATIVE
Specific Gravity, URN, External: 1.009
Urobilinogen, URN, External: 0.2
pH, URN, External: 6

## 2013-12-03 LAB — HEPATIC FUNCTION PANEL, EXTERNAL
ALT (GPT), External: 17
AST (GOT), External: 25
Albumin, External: 4.7
Alkaline Phosphatase (Total), External: 138
Bilirubin (Total), External: 0.7
Protein (Total), External: 7.1

## 2013-12-03 LAB — PHOSPHATE, EXTERNAL: Phosphate, External: 4.2

## 2013-12-03 LAB — COMPREHENSIVE METABOLIC PANEL, EXTERNAL: GFR, Calc, European American, External: 64

## 2013-12-03 LAB — CYCLOSPORINE, EXTERNAL: Cyclosporine By LC_MS, External: 152

## 2013-12-14 ENCOUNTER — Other Ambulatory Visit (HOSPITAL_COMMUNITY): Payer: Self-pay

## 2013-12-17 ENCOUNTER — Encounter (HOSPITAL_BASED_OUTPATIENT_CLINIC_OR_DEPARTMENT_OTHER): Payer: Self-pay

## 2013-12-21 NOTE — Progress Notes (Signed)
Updated mycophenolate and cyclosporine dose per 12/17/2013 [facility] clinic note

## 2013-12-22 ENCOUNTER — Other Ambulatory Visit (HOSPITAL_BASED_OUTPATIENT_CLINIC_OR_DEPARTMENT_OTHER): Payer: Self-pay

## 2013-12-22 DIAGNOSIS — Z940 Kidney transplant status: Secondary | ICD-10-CM

## 2013-12-27 ENCOUNTER — Other Ambulatory Visit (HOSPITAL_BASED_OUTPATIENT_CLINIC_OR_DEPARTMENT_OTHER): Payer: Self-pay

## 2013-12-27 DIAGNOSIS — Z940 Kidney transplant status: Secondary | ICD-10-CM

## 2013-12-27 MED ORDER — MAGNESIUM OXIDE -MG SUPPLEMENT 400 (240 MG) MG OR TABS
400.0000 mg | ORAL_TABLET | Freq: Two times a day (BID) | ORAL | Status: DC
Start: 2013-12-27 — End: 2013-12-27

## 2013-12-27 MED ORDER — MAGNESIUM OXIDE -MG SUPPLEMENT 400 (240 MG) MG OR TABS
400.0000 mg | ORAL_TABLET | Freq: Two times a day (BID) | ORAL | Status: DC
Start: 2013-12-27 — End: 2014-03-01

## 2013-12-28 ENCOUNTER — Other Ambulatory Visit (HOSPITAL_COMMUNITY): Payer: Self-pay

## 2013-12-29 ENCOUNTER — Other Ambulatory Visit (HOSPITAL_COMMUNITY): Payer: Self-pay

## 2013-12-29 LAB — HEPATIC FUNCTION PANEL, EXTERNAL
ALT (GPT), External: 14
AST (GOT), External: 17
Albumin, External: 4.5
Alkaline Phosphatase (Total), External: 131
Bilirubin (Total), External: 0.8
Protein (Total), External: 7.1

## 2013-12-29 LAB — UA MICROSCOPIC W/REFLEX CULTURE, EXTERNAL
Bacteria, URN, External: NONE SEEN
Epith Cells_Squamous, URN, External: NONE SEEN

## 2013-12-29 LAB — CBC, DIFF, EXTERNAL
% Basophils, External: 0
% Eosinophils, External: 2
% Lymphocytes, External: 27
% Monocytes, External: 11
% Neutrophils, External: 60
Basophils, External: 0
Eosinophil Count, External: 0.1
Lymphocyte Count, External: 1.7
Monocytes, External: 0.7
Neutrophils, External: 3.9

## 2013-12-29 LAB — CBC (HEMOGRAM), EXTERNAL
Hematocrit, External: 40.4
Hemoglobin, External: 14
MCH, External: 29.3
MCV, External: 85
Platelet Count, External: 201
WBC, External: 6.5

## 2013-12-29 LAB — UA MACROSCOPIC W/REFLEX CULTURE, EXTERNAL
Bilirubin Qual, URN, External: NEGATIVE
Glucose Qual, URN, External: NEGATIVE
Ketones, URN, External: NEGATIVE
Leukocyte Esterase, URN, External: NEGATIVE
Nitrite, URN, External: NEGATIVE
Occult Blood, URN, External: NEGATIVE
Protein (Alb Semi Quant), URN, External: NEGATIVE
Specific Gravity, URN, External: 1.022
Urobilinogen, URN, External: 0.2
pH, URN, External: 6

## 2013-12-29 LAB — COMPREHENSIVE METABOLIC PANEL, EXTERNAL: GFR, Calc, European American, External: 65

## 2013-12-29 LAB — BASIC METABOLIC PANEL, EXTERNAL
Calcium, External: 9.6
Carbon Dioxide (Total), External: 22
Chloride, External: 99
Creatinine, External: 1.46
Glucose, External: 97
Potassium, External: 4.4
Sodium, External: 139
Urea Nitrogen, External: 18

## 2013-12-29 LAB — PROTEIN/CREATININE RATIO, URN, EXTERNAL
Creatinine/Unit, URN, External: 195
Protein (Total), URN, External: 10.7
Protein/Creatinine Ratio, External: 55

## 2013-12-29 LAB — PHOSPHATE, EXTERNAL: Phosphate, External: 5.1

## 2013-12-29 LAB — CYCLOSPORINE, EXTERNAL: Cyclosporine By LC_MS, External: 95

## 2013-12-29 LAB — MAGNESIUM, EXTERNAL: Magnesium, External: 1.6

## 2014-01-05 ENCOUNTER — Encounter (HOSPITAL_BASED_OUTPATIENT_CLINIC_OR_DEPARTMENT_OTHER): Payer: Self-pay | Admitting: Nephrology

## 2014-01-05 ENCOUNTER — Ambulatory Visit: Payer: 59 | Attending: Nephrology | Admitting: Nephrology

## 2014-01-05 ENCOUNTER — Other Ambulatory Visit (HOSPITAL_BASED_OUTPATIENT_CLINIC_OR_DEPARTMENT_OTHER): Payer: Self-pay

## 2014-01-05 VITALS — BP 151/99 | HR 89 | Temp 98.4°F | Ht 66.0 in | Wt 139.6 lb

## 2014-01-05 DIAGNOSIS — I10 Essential (primary) hypertension: Secondary | ICD-10-CM | POA: Insufficient documentation

## 2014-01-05 DIAGNOSIS — D849 Immunodeficiency, unspecified: Secondary | ICD-10-CM

## 2014-01-05 DIAGNOSIS — D899 Disorder involving the immune mechanism, unspecified: Secondary | ICD-10-CM

## 2014-01-05 DIAGNOSIS — Z940 Kidney transplant status: Secondary | ICD-10-CM

## 2014-01-05 MED ORDER — LISINOPRIL 5 MG OR TABS
5.0000 mg | ORAL_TABLET | Freq: Every evening | ORAL | Status: DC
Start: 2014-01-05 — End: 2014-01-05

## 2014-01-05 MED ORDER — LISINOPRIL 5 MG OR TABS
5.0000 mg | ORAL_TABLET | Freq: Every evening | ORAL | Status: DC
Start: 2014-01-05 — End: 2014-03-01

## 2014-01-05 NOTE — Progress Notes (Signed)
Transplant Nephrology Outpatient Initial visit  Encounter Date: 01/05/2014  ACOMMPANIED BY: This patient is accompanied in the office by his sister.     Chief Complaint: 28 year old man with history of LRT 11/11/12 from his brother (Ht 5'4", Wt 170lbs), here to discuss management, including biopsy and immunosuppression.      History of Present Illness:  Herbert Robertson is a 28 year old man with ESRD of unclear etiology, now with kidney transplant. At about the age of 28 or [age], he was seen by a doctor for headaches.  He was found to have blood pressures 240s/120s and was admitted for blood pressure control.  He had chronic kidney disease at that time which progressed over years to CKD stage V.  He does not have a primary diagnosis for his kidney disease - serologic workup was negative.  He underwent living related kidney [facility] [place] [facility], [facility]. He received ATG 5.4mg /kg and was started on an FK/MMF regimen (steroid-avoidance).  An early biopsy 11/27/12 for elevated creatinine did not show any evidence of rejection, was complicated by bladder clots requiring temporary foley placement.  He was eventually changed to cyclosporine / MMF at his request due to hair loss attributed to tacrolimus.  He has continued on this regimen with fluctuating creatinine - sometimes as high as 1.7 or 1.8, and he thinks his baseline creatinine is 1.4-1.6, confirmed by his nephrologist. He has not been biopsied since.  He has not had any proteinuria or cells in the urine.  His brother is doing well, and he has a creatinine of 1.3.    He has no complaints today, is exercising 4 days/week, is sleeping better and eating high protein diet. He does have difficulty with weight loss. His sister notes that he is much more pleasant since he has stopped playing a shooting video game.    Review of Systems -   Otherwise negative except for noted in the HPI.      Past Medical History  I personally reviewed  and confirmed the ROS, past medical history, past surgical history, family history and social history in the record with the patient today.      Patient Active Problem List   Diagnosis   . Immunosuppression   . 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 Herpetic gingivostomatitis   . CMV donor positive, recipient positive   . Lung nodule seen on imaging study        Past Medical History   Diagnosis Date   . Chronic kidney disease, stage V 09/22/2013     CKD and severe hypertension since 2006, unknown etiology. Preemptive kidney transplant 2014    . Hypertension 11/15/2012   . 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 ATG induction 5.4mg /kg (300mg ) Biopsy for increasing creatinine 11/27/12, no rejection. Baseline creatinine 1.4         Past Surgical History   Procedure Laterality Date   . Renal altrnsplj impltj grf w/o rcp nephrectomy          Review of patient's allergies indicates:  Allergies   Allergen Reactions   . Sulfa Antibiotics      Other reaction(s): Unknown Reaction        Current Outpatient Prescriptions   Medication Sig Dispense Refill   . Cholecalciferol 1000 UNITS Oral Tab Take 1,000 Units by mouth daily.     . CycloSPORINE 25 MG Oral Cap Take 75 mg by mouth  every 12 hours.     . Ferrous Sulfate 324 (65 FE) MG Oral Tab EC Take 1 tablet (324 mg) by mouth daily. 30 tablet 5   . Magnesium Oxide 400 (240 MG) MG Oral Tab Take 1 tablet (400 mg) by mouth 2 times a day. 180 tablet 3   . Mycophenolate Mofetil 500 MG Oral Tab Take 1,500 mg by mouth 2 times a day.       No current facility-administered medications for this visit.        Medication Notes:   I have reviewed the above list of medicines with the patient and it is accurate.     Family History   Problem Relation Age of Onset   . Diabetes Father    . Heart Disease No Hx Of    . Cancer No Hx Of    . Renal Disease No Hx Of    . Diabetes Paternal Grandmother         History     Social History   .  Marital Status: Single     Spouse Name: N/A     Number of Children: N/A   . Years of Education: N/A     Occupational History   . Not on file.     Social History Main Topics   . Smoking status: Not on file   . Smokeless tobacco: Not on file   . Alcohol Use: Not on file   . Drug Use: Not on file   . Sexual Activity: Not on file     Other Topics Concern   . Not on file     Social History Narrative    Moved from [place] to [place] in 2015 after completing his studies.  Lives with sister and brother.        Physical Exam:   Filed Vitals:    01/05/14 0925   BP: 151/99   Pulse: 89   Temp: 98.4 F (36.9 C)   TempSrc: Temporal   Height: 5\' 6"  (1.676 m)   Weight: 139 lb 8.8 oz (63.299 kg)   SpO2: 100%     Estimated body mass index is 22.53 kg/(m^2) as calculated from the following:    Height as of this encounter: 5\' 6"  (1.676 m).    Weight as of this encounter: 139 lb 8.8 oz (63.299 kg).   General: 28 year old male in no acute distress.  HEENT: Anicteric sclerae, moist mucosa without erythema or exudate.  NECK: Supple, without cervical or supraclavicular lymphadenopathy or jugular venous distention.   LUNGS: Clear to auscultation, without wheeze or rales.  Cardiovascular: Regular rate or rhthym, without murmur, gallop or rub.  Abdominal: Soft, Nontender, nondistended, normal active bowel sounds. Incision is well healed. Allograft is nontender in Right lower quadrant, and without bruit.  Extremities: No clubbing, cyanosis, or edema.  Neurologic: Grossly normal, without tremor. Alert & oriented x4.  Skin: Normal without rash.          RESULTS REVIEWED.  No results found for any visits on 01/05/14.      Impression and Plan:  Herbert Robertson is a 28 year old man with ESRD of unknown etiology, now s/p LRT 11/11/12 from his brother [facility] [place]    1. Renal allograft function. Baseline creatinine 1.4-1.6 without proteinuria; DSA 11/03/13 was negative. Normal UA. No indication for biopsy at this time.    2. Immunosuppression.  ATG  5.6mg /kg induction.  Steroid-free regimen.  - Continuie cyclosporine 100mg  bid, with goal  80-140ng/ml at this time until further data is available from transplant.  - continue MMF 1g bid  - off tacrolimus (hair loss) and on steroid avoidance protocol from time of transplant.    3. HTN: reportedly high-normal at home, and elevated today; Will start lisinopril 5mg  qhs, decrease sodium intake, continue daily exercise, and more regular sleep.  - Will recheck labs in 2 weeks, then monthly    4. Hypomagnesemia. Normal on current dose.  - continue supplementation    5. Fe deficiency anemia. Normal Hgb; Will continue on FeSO4 supplementation and follow CBC.    6. Health maintenance. Influenza vaccination received earlier this autumn. Advised attention to health, including cognitive engagement with memory games and books since he is concerned about his memory recall.    Labs in 2 weeks, then q month.  RTC 3 months.    Jarome Lamas, MD  Department of Medicine - Division of Nephrology & Transplantation  [facility]  Pager [phone]

## 2014-02-07 ENCOUNTER — Other Ambulatory Visit (HOSPITAL_COMMUNITY): Payer: Self-pay

## 2014-02-07 LAB — HEPATIC FUNCTION PANEL, EXTERNAL
ALT (GPT), External: 14
AST (GOT), External: 16
Albumin, External: 4.8
Alkaline Phosphatase (Total), External: 173
Bilirubin (Total), External: 0.7
Protein (Total), External: 7.5

## 2014-02-07 LAB — LIPID PANEL, EXTERNAL
Cholesterol (HDL), External: 43
Cholesterol (LDL), External: 99
Cholesterol (Total), External: 175
Triglyceride, External: 163

## 2014-02-07 LAB — CBC (HEMOGRAM), EXTERNAL
Hematocrit, External: 44.9
Hemoglobin, External: 15.5
MCH, External: 28.5
MCV, External: 83
Platelet Count, External: 211
WBC, External: 6.7

## 2014-02-07 LAB — UA MACROSCOPIC W/REFLEX CULTURE, EXTERNAL
Bilirubin Qual, URN, External: NEGATIVE
Glucose Qual, URN, External: NEGATIVE
Ketones, URN, External: NEGATIVE
Leukocyte Esterase, URN, External: NEGATIVE
Nitrite, URN, External: NEGATIVE
Occult Blood, URN, External: NEGATIVE
Protein (Alb Semi Quant), URN, External: NEGATIVE
Specific Gravity, URN, External: 1.016
Urobilinogen, URN, External: 0.2
pH, URN, External: 6

## 2014-02-07 LAB — CBC, DIFF, EXTERNAL
% Basophils, External: 0
% Eosinophils, External: 2
% Lymphocytes, External: 25
% Monocytes, External: 8
% Neutrophils, External: 65
Basophils, External: 0
Eosinophil Count, External: 0.1
Lymphocyte Count, External: 1.7
Monocytes, External: 0.5
Neutrophils, External: 4.4

## 2014-02-07 LAB — BASIC METABOLIC PANEL, EXTERNAL
Calcium, External: 9.6
Carbon Dioxide (Total), External: 20
Chloride, External: 99
Creatinine, External: 1.49
Glucose, External: 100
Potassium, External: 4.7
Sodium, External: 100
Urea Nitrogen, External: 18

## 2014-02-07 LAB — UA MICROSCOPIC W/REFLEX CULTURE, EXTERNAL: Bacteria, URN, External: NONE SEEN

## 2014-02-07 LAB — CYCLOSPORINE, EXTERNAL: Cyclosporine By LC_MS, External: 72

## 2014-02-07 LAB — MAGNESIUM, EXTERNAL: Magnesium, External: 1.8

## 2014-02-07 LAB — PROTEIN/CREATININE RATIO, URN, EXTERNAL
Creatinine/Unit, URN, External: 109.5
Protein (Total), URN, External: 4.5
Protein/Creatinine Ratio, External: 41

## 2014-02-07 LAB — COMPREHENSIVE METABOLIC PANEL, EXTERNAL: GFR, Calc, European American, External: 64

## 2014-02-07 LAB — PHOSPHATE, EXTERNAL: Phosphate, External: 4.2

## 2014-03-01 ENCOUNTER — Other Ambulatory Visit (HOSPITAL_BASED_OUTPATIENT_CLINIC_OR_DEPARTMENT_OTHER): Payer: Self-pay

## 2014-03-01 DIAGNOSIS — I10 Essential (primary) hypertension: Secondary | ICD-10-CM

## 2014-03-01 DIAGNOSIS — Z940 Kidney transplant status: Secondary | ICD-10-CM

## 2014-03-01 NOTE — Telephone Encounter (Signed)
Pt called that he needs all his meds re-ordered with [facility]. Confirmed dosing with him.

## 2014-03-02 MED ORDER — MYCOPHENOLATE MOFETIL 500 MG OR TABS
1000.0000 mg | ORAL_TABLET | Freq: Two times a day (BID) | ORAL | Status: DC
Start: 2014-03-02 — End: 2014-12-09

## 2014-03-02 MED ORDER — MAGNESIUM OXIDE -MG SUPPLEMENT 400 (240 MG) MG OR TABS
400.0000 mg | ORAL_TABLET | Freq: Two times a day (BID) | ORAL | Status: DC
Start: 2014-03-02 — End: 2014-03-16

## 2014-03-02 MED ORDER — LISINOPRIL 5 MG OR TABS
5.0000 mg | ORAL_TABLET | Freq: Every evening | ORAL | Status: DC
Start: 2014-03-02 — End: 2015-02-01

## 2014-03-02 MED ORDER — CHOLECALCIFEROL 25 MCG (1000 UT) OR TABS
1000.0000 [IU] | ORAL_TABLET | Freq: Every day | ORAL | Status: DC
Start: 2014-03-02 — End: 2014-03-16

## 2014-03-02 MED ORDER — FERROUS SULFATE 324 (65 FE) MG OR TBEC
324.0000 mg | DELAYED_RELEASE_TABLET | Freq: Every day | ORAL | Status: DC
Start: 2014-03-02 — End: 2014-03-16

## 2014-03-02 MED ORDER — CYCLOSPORINE 25 MG OR CAPS
75.0000 mg | ORAL_CAPSULE | Freq: Two times a day (BID) | ORAL | Status: DC
Start: 2014-03-02 — End: 2014-04-11

## 2014-03-09 ENCOUNTER — Other Ambulatory Visit (HOSPITAL_COMMUNITY): Payer: Self-pay

## 2014-03-09 LAB — UA MACROSCOPIC W/REFLEX CULTURE, EXTERNAL
Bilirubin Qual, URN, External: NEGATIVE
Glucose Qual, URN, External: NEGATIVE
Ketones, URN, External: NEGATIVE
Leukocyte Esterase, URN, External: NEGATIVE
Nitrite, URN, External: NEGATIVE
Occult Blood, URN, External: NEGATIVE
Protein (Alb Semi Quant), URN, External: NEGATIVE
Specific Gravity, URN, External: 1.018
Urobilinogen, URN, External: 0.2
pH, URN, External: 6

## 2014-03-09 LAB — BASIC METABOLIC PANEL, EXTERNAL
Calcium, External: 9.5
Carbon Dioxide (Total), External: 20
Chloride, External: 102
Creatinine, External: 1.69
Glucose, External: 97
Potassium, External: 5
Sodium, External: 140
Urea Nitrogen, External: 21

## 2014-03-09 LAB — HEPATIC FUNCTION PANEL, EXTERNAL
ALT (GPT), External: 11
AST (GOT), External: 15
Albumin, External: 4.6
Alkaline Phosphatase (Total), External: 149
Bilirubin (Total), External: 0.5
Protein (Total), External: 6.8

## 2014-03-09 LAB — CBC (HEMOGRAM), EXTERNAL
Hematocrit, External: 39.8
Hemoglobin, External: 13.6
MCH, External: 28.9
MCV, External: 85
Platelet Count, External: 215
WBC, External: 6.5

## 2014-03-09 LAB — UA MICROSCOPIC W/REFLEX CULTURE, EXTERNAL: Epith Cells_Squamous, URN, External: 0

## 2014-03-09 LAB — COMPREHENSIVE METABOLIC PANEL, EXTERNAL: GFR, Calc, European American, External: 54

## 2014-03-09 LAB — PROTEIN/CREATININE RATIO, URN, EXTERNAL
Creatinine/Unit, URN, External: 161.8
Protein (Total), URN, External: 6.8
Protein/Creatinine Ratio, External: 42

## 2014-03-09 LAB — CYCLOSPORINE, EXTERNAL: Cyclosporine By LC_MS, External: 98

## 2014-03-09 LAB — CBC, DIFF, EXTERNAL
% Basophils, External: 1
% Eosinophils, External: 2
% Lymphocytes, External: 24
% Monocytes, External: 8
% Neutrophils, External: 65
Basophils, External: 0
Eosinophil Count, External: 0.2
Lymphocyte Count, External: 1.6
Monocytes, External: 0.5
Neutrophils, External: 4.2

## 2014-03-09 LAB — PHOSPHATE, EXTERNAL: Phosphate, External: 4.7

## 2014-03-09 LAB — MAGNESIUM, EXTERNAL: Magnesium, External: 2.1

## 2014-03-16 ENCOUNTER — Other Ambulatory Visit (HOSPITAL_BASED_OUTPATIENT_CLINIC_OR_DEPARTMENT_OTHER): Payer: Self-pay

## 2014-03-16 DIAGNOSIS — Z940 Kidney transplant status: Secondary | ICD-10-CM

## 2014-03-16 MED ORDER — CHOLECALCIFEROL 25 MCG (1000 UT) OR TABS
1000.0000 [IU] | ORAL_TABLET | Freq: Every day | ORAL | Status: AC
Start: 2014-03-16 — End: ?

## 2014-03-16 MED ORDER — FERROUS SULFATE 324 (65 FE) MG OR TBEC
324.0000 mg | DELAYED_RELEASE_TABLET | Freq: Every day | ORAL | Status: DC
Start: 2014-03-16 — End: 2014-10-17

## 2014-03-16 MED ORDER — MAGNESIUM OXIDE -MG SUPPLEMENT 400 (240 MG) MG OR TABS
400.0000 mg | ORAL_TABLET | Freq: Two times a day (BID) | ORAL | Status: DC
Start: 2014-03-16 — End: 2014-10-17

## 2014-03-16 NOTE — Telephone Encounter (Signed)
Pt called to request that his mag/iron/cholecalciferol be sent to [facility] since [facility] won't send him OTC meds. These have already been approved, so will order/sign with cosign with Dr Assunta FoundBlosser.

## 2014-04-06 ENCOUNTER — Ambulatory Visit: Payer: 59 | Attending: Nephrology | Admitting: Nephrology

## 2014-04-06 ENCOUNTER — Encounter (HOSPITAL_BASED_OUTPATIENT_CLINIC_OR_DEPARTMENT_OTHER): Payer: Self-pay | Admitting: Nephrology

## 2014-04-06 ENCOUNTER — Other Ambulatory Visit (HOSPITAL_BASED_OUTPATIENT_CLINIC_OR_DEPARTMENT_OTHER): Payer: Self-pay | Admitting: Nephrology

## 2014-04-06 ENCOUNTER — Other Ambulatory Visit (HOSPITAL_BASED_OUTPATIENT_CLINIC_OR_DEPARTMENT_OTHER): Payer: Self-pay

## 2014-04-06 VITALS — BP 120/78 | HR 84 | Temp 98.6°F | Ht 66.0 in | Wt 131.3 lb

## 2014-04-06 DIAGNOSIS — D849 Immunodeficiency, unspecified: Secondary | ICD-10-CM

## 2014-04-06 DIAGNOSIS — D649 Anemia, unspecified: Secondary | ICD-10-CM

## 2014-04-06 DIAGNOSIS — I10 Essential (primary) hypertension: Secondary | ICD-10-CM | POA: Insufficient documentation

## 2014-04-06 DIAGNOSIS — Z5181 Encounter for therapeutic drug level monitoring: Secondary | ICD-10-CM

## 2014-04-06 DIAGNOSIS — Z79899 Other long term (current) drug therapy: Secondary | ICD-10-CM

## 2014-04-06 DIAGNOSIS — Z940 Kidney transplant status: Secondary | ICD-10-CM

## 2014-04-06 DIAGNOSIS — D899 Disorder involving the immune mechanism, unspecified: Secondary | ICD-10-CM | POA: Insufficient documentation

## 2014-04-06 LAB — URINALYSIS COMPLETE, URN
Bacteria, URN: NONE SEEN
Bilirubin (Qual), URN: NEGATIVE
Epith Cells_Renal/Trans,URN: NEGATIVE /HPF
Epith Cells_Squamous, URN: NEGATIVE /LPF
Glucose Qual, URN: NEGATIVE mg/dL
Ketones, URN: NEGATIVE mg/dL
Leukocyte Esterase, URN: NEGATIVE
Nitrite, URN: NEGATIVE
Occult Blood, URN: NEGATIVE
Protein (Alb Semiquant), URN: NEGATIVE mg/dL
RBC, URN: NEGATIVE /HPF
Specific Gravity, URN: 1.011 g/mL (ref 1.002–1.027)
WBC, URN: NEGATIVE /HPF

## 2014-04-06 LAB — COMPREHENSIVE METABOLIC PANEL
ALT (GPT): 13 U/L (ref 10–64)
AST (GOT): 16 U/L (ref 9–38)
Albumin: 4.5 g/dL (ref 3.5–5.2)
Alkaline Phosphatase (Total): 117 U/L — ABNORMAL HIGH (ref 35–109)
Anion Gap: 7 (ref 4–12)
Bilirubin (Total): 0.9 mg/dL (ref 0.2–1.3)
Calcium: 9.6 mg/dL (ref 8.9–10.2)
Carbon Dioxide, Total: 24 mEq/L (ref 22–32)
Chloride: 104 mEq/L (ref 98–108)
Creatinine: 1.79 mg/dL — ABNORMAL HIGH (ref 0.51–1.18)
GFR, Calc, African American: 55 mL/min — ABNORMAL LOW (ref >59)
GFR, Calc, European American: 46 mL/min — ABNORMAL LOW (ref >59)
Glucose: 109 mg/dL (ref 62–125)
Potassium: 4 mEq/L (ref 3.6–5.2)
Protein (Total): 7.5 g/dL (ref 6.0–8.2)
Sodium: 135 mEq/L (ref 135–145)
Urea Nitrogen: 23 mg/dL — ABNORMAL HIGH (ref 8–21)

## 2014-04-06 LAB — CYCLOSPORINE BY LCMSMS: Cyclosporine By Lc_Ms: 71 ng/mL

## 2014-04-06 LAB — CBC, DIFF
% Basophils: 0 %
% Eosinophils: 2 %
% Immature Granulocytes: 0 %
% Lymphocytes: 17 %
% Monocytes: 5 %
% Neutrophils: 76 %
Absolute Eosinophil Count: 0.12 10*3/uL (ref 0.00–0.50)
Absolute Lymphocyte Count: 1.2 10*3/uL (ref 1.00–4.80)
Basophils: 0.02 10*3/uL (ref 0.00–0.20)
Hematocrit: 35 % — ABNORMAL LOW (ref 38–50)
Hemoglobin: 12.1 g/dL — ABNORMAL LOW (ref 13.0–18.0)
Immature Granulocytes: 0.01 10*3/uL (ref 0.00–0.05)
MCH: 28.1 pg (ref 27.3–33.6)
MCHC: 34.4 g/dL (ref 32.2–36.5)
MCV: 82 fL (ref 81–98)
Monocytes: 0.31 10*3/uL (ref 0.00–0.80)
Neutrophils: 5.28 10*3/uL (ref 1.80–7.00)
Platelet Count: 187 10*3/uL (ref 150–400)
RBC: 4.31 mil/uL — ABNORMAL LOW (ref 4.40–5.60)
RDW-CV: 12.7 % (ref 11.6–14.4)
WBC: 6.94 10*3/uL (ref 4.3–10.0)

## 2014-04-06 LAB — LIPID PANEL
Cholesterol (LDL): 102 mg/dL (ref <130)
Cholesterol/HDL Ratio: 3.9
HDL Cholesterol: 40 mg/dL (ref >39)
Non-HDL Cholesterol: 117 mg/dL (ref 0–159)
Total Cholesterol: 157 mg/dL (ref <200)
Triglyceride: 77 mg/dL (ref <150)

## 2014-04-06 LAB — PROTEIN/CREATININE RATIO, TIMED URINE
Creatinine/Unit, Urine: 133 mg/dL
Protein (Total), Urine: 10 mg/dL
Protein/Creatinine Ratio: <0.1 (ref <0.2)

## 2014-04-06 LAB — MAGNESIUM: Magnesium: 2.1 mg/dL (ref 1.8–2.4)

## 2014-04-06 LAB — PHOSPHATE: Phosphate: 4 mg/dL (ref 2.5–4.5)

## 2014-04-06 LAB — TACROLIMUS BY LCMSMS

## 2014-04-06 NOTE — Progress Notes (Signed)
Transplant Nephrology Outpatient Initial visit  Encounter Date: 04/06/2014    ACOMMPANIED BY: This patient is accompanied in the office by his sister.     Chief Complaint: 29 year old man with history of LRT 11/11/12 from his brother (Ht 5'4", Wt 170lbs), here to discuss management, including immunosuppression.      History of Present Illness:  Herbert Robertson is a 29 year old man with ESRD of unclear etiology - serologic workup was negative, now with kidney transplant. He underwent living related kidney transplant from his brother at the [facility] [place] November 11, 2012. He received ATG 5.4mg /kg and was started on an FK/MMF regimen (steroid-avoidance).  An early biopsy 11/27/12 for elevated creatinine did not show any evidence of rejection, was complicated by bladder clots requiring temporary foley placement.  He was eventually changed to cyclosporine / MMF at his request due to hair loss attributed to tacrolimus.  He has continued on this regimen with fluctuating creatinine - sometimes as high as 1.7 or 1.8, and he thinks his baseline creatinine is 1.4-1.6, confirmed by his nephrologist. He has not been biopsied since.  He has not had any proteinuria or cells in the urine.  His brother is doing well, and he reportedly has a creatinine of 1.3.    He returns on his own today without complaints, but is interested in how he can lose adipose and increase his muscle mass and height. He is recovering from a viral URI with diminishing rhinorrhea. He is searching for full time job and reports interrupted sleep because he goes to sleep late (12-3am) and is awakened by work-related calls. He has been intermittently exercising (weight lifting, core exercises) and eating high protein diet (red meat). He denies missing or being late on any of his meds.    Review of Systems -   Otherwise negative except for noted in the HPI.      Past Medical History  I personally reviewed and confirmed the ROS, past medical history, past  surgical history, family history and social history in the record with the patient today.      Patient Active Problem List   Diagnosis   . Immunosuppression   . 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 Herpetic gingivostomatitis   . CMV donor positive, recipient positive   . Lung nodule seen on imaging study   . HTN, goal below 130/80        Past Medical History   Diagnosis Date   . Chronic kidney disease, stage V 09/22/2013     CKD and severe hypertension since 2006, unknown etiology. Preemptive kidney transplant 2014    . Hypertension 11/15/2012   . 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 ATG induction 5.4mg /kg (300mg ) Biopsy for increasing creatinine 11/27/12, no rejection. Baseline creatinine 1.4         Past Surgical History   Procedure Laterality Date   . Renal altrnsplj impltj grf w/o rcp nephrectomy          Review of patient's allergies indicates:  Allergies   Allergen Reactions   . Sulfa Antibiotics      Other reaction(s): Unknown Reaction        Current Outpatient Prescriptions   Medication Sig Dispense Refill   . Cholecalciferol 1000 UNITS Oral Tab Take 1 tablet (1,000 Units) by mouth daily. 90 tablet 3   . CycloSPORINE 25 MG Oral Cap Take 3 capsules (75 mg) by mouth every 12 hours. 270 capsule 3   .  Ferrous Sulfate 324 (65 FE) MG Oral Tab EC Take 1 tablet (324 mg) by mouth daily. 90 tablet 3   . Lisinopril 5 MG Oral Tab Take 1 tablet (5 mg) by mouth at bedtime. 90 tablet 3   . Magnesium Oxide 400 (240 MG) MG Oral Tab Take 1 tablet (400 mg) by mouth 2 times a day. 180 tablet 3   . Mycophenolate Mofetil 500 MG Oral Tab Take 2 tablets (1,000 mg) by mouth 2 times a day. 360 tablet 3     No current facility-administered medications for this visit.        Medication Notes:   I have reviewed the above list of medicines with the patient and it is accurate.     Family History   Problem Relation Age of Onset   . Diabetes Father    . Heart Disease No Hx Of     . Cancer No Hx Of    . Renal Disease No Hx Of    . Diabetes Paternal Grandmother         History     Social History   . Marital Status: Single     Spouse Name: N/A   . Number of Children: N/A   . Years of Education: N/A     Occupational History   . Not on file.     Social History Main Topics   . Smoking status: Never Smoker    . Smokeless tobacco: Never Used   . Alcohol Use: No   . Drug Use: No   . Sexual Activity: Not on file     Other Topics Concern   . Not on file     Social History Narrative    Moved from [place] to [place] in 2015 after completing his studies.  Lives with sister and brother.        Physical Exam:   Filed Vitals:    04/06/14 0924 04/06/14 0947   BP: 147/89 120/78   Pulse: 106 84   Temp: 98.6 F (37 C)    TempSrc: Temporal    Height:  (1.676 m)    Weight: 131 lb 4.8 oz (59.557 kg)    SpO2: 100%      Estimated body mass index is 21.2 kg/(m^2) as calculated from the following:    Height as of this encounter:  (1.676 m).    Weight as of this encounter: 131 lb 4.8 oz (59.557 kg).   General: 29 year old male in no acute distress.  HEENT: Anicteric sclerae, moist mucosa without erythema or exudate.  NECK: Supple, without cervical or supraclavicular lymphadenopathy or jugular venous distention.   LUNGS: Clear to auscultation, without wheeze or rales.  Cardiovascular: Regular rate or rhthym, without murmur, gallop or rub.  Abdominal: Soft, Nontender, nondistended, normal active bowel sounds. Incision is well healed. Allograft is nontender in Right lower quadrant, and without bruit.  Extremities: No clubbing, cyanosis, or edema.  Neurologic: Grossly normal, without tremor. Alert & oriented x4.  Skin: Normal without rash.          RESULTS REVIEWED.  Office Visit on 04/06/14   1. CYCLOSPORINE BY LCMSMS   Result Value Ref Range    Cyclosporine By Lc_Ms 71 ng/mL    Cyclosporine Information Whole blood Cyclosporine was measured by LCMSMS.    Orders Only on 04/06/14   2. COMPREHENSIVE METABOLIC  PANEL   Result Value Ref Range    Sodium 135 135 - 145 mEq/L  Potassium 4.0 3.6 - 5.2 mEq/L    Chloride 104 98 - 108 mEq/L    Carbon Dioxide, Total 24 22 - 32 mEq/L    Anion Gap 7 4 - 12    Glucose 109 62 - 125 mg/dL    Urea Nitrogen 23 (H) 8 - 21 mg/dL    Creatinine 1.611.79 (H) 0.51 - 1.18 mg/dL    Protein (Total) 7.5 6.0 - 8.2 g/dL    Albumin 4.5 3.5 - 5.2 g/dL    Bilirubin (Total) 0.9 0.2 - 1.3 mg/dL    Calcium 9.6 8.9 - 09.610.2 mg/dL    AST (GOT) 16 9 - 38 U/L    Alkaline Phosphatase (Total) 117 (H) 35 - 109 U/L    ALT (GPT) 13 10 - 64 U/L    GFR, Calc, European American 46 (L) >59 mL/min    GFR, Calc, African American 55 (L) >59 mL/min    GFR, Information       Calculated GFR in mL/min/1.73 m2 by MDRD equation.  Inaccurate with changing renal function.  See http://depts.[employer].edu/labweb/test/bclim/cGFR.html   3. CBC, DIFF   Result Value Ref Range    WBC 6.94 4.3 - 10.0 THOU/uL    RBC 4.31 (L) 4.40 - 5.60 mil/uL    Hemoglobin 12.1 (L) 13.0 - 18.0 g/dL    Hematocrit 35 (L) 38 - 50 %    MCV 82 81 - 98 fL    MCH 28.1 27.3 - 33.6 pg    MCHC 34.4 32.2 - 36.5 g/dL    Platelet Count 045187 409150 - 400 THOU/uL    RDW-CV 12.7 11.6 - 14.4 %    % Neutrophils 76 %    % Lymphocytes 17 %    % Monocytes 5 %    % Eosinophils 2 %    % Basophils 0 %    % Immature Granulocytes 0 %    Neutrophils 5.28 1.80 - 7.00 THOU/uL    Absolute Lymphocyte Count 1.20 1.00 - 4.80 THOU/uL    Monocytes 0.31 0.00 - 0.80 THOU/uL    Absolute Eosinophil Count 0.12 0.00 - 0.50 THOU/uL    Basophils 0.02 0.00 - 0.20 THOU/uL    Immature Granulocytes 0.01 0.00 - 0.05 THOU/uL   4. MAGNESIUM   Result Value Ref Range    Magnesium 2.1 1.8 - 2.4 mg/dL   5. PHOSPHATE   Result Value Ref Range    Phosphate 4.0 2.5 - 4.5 mg/dL   6. TACROLIMUS BY LCMSMS   Result Value Ref Range    Tacrolimus By LCMSMS Canceled by practitioner ng/mL    Tacrolimus Information Canceled by practitioner    7. Urinalysis Complete, URN   Result Value Ref Range    Color, URN Yellow     Clarity,  URN Clear     Specific Gravity, URN 1.011 1.002 - 1.027 g/mL    pH, URN < or =5.0 5.0 - 8.0    Protein (Alb Semiquant), URN Negative NRN mg/dL    Glucose Qual, URN Negative NRN mg/dL    Ketones, URN Negative NRN mg/dL    Bilirubin (Qual), URN Negative NRN    Occult Blood, URN Negative NRN    Nitrite, URN Negative NRN    Leukocyte Esterase, URN Negative NRN    Urobilinogen, URN 0.1-1.9 URONML EHRLICH UNITS    Comments for Macroscopic, URN None     WBC, URN 0-5(NEG) Z5NEG /HPF    RBC, URN 0-2(NEG) Z2NEG /HPF    Bacteria, URN Not Seen  NOSEEN    Epith Cells_Squamous, URN 0-5(NEG) LT6 /LPF    Epith Cells_Renal/Trans,URN <3(NEG) LESS3 /HPF    Comments For Microscopic, URN None NONE    Mucus, URN Present (A) NOSEEN /LPF   8. URINE PROTEIN/CREATININE RATIO   Result Value Ref Range    Protein/Creat Ratio Int, URN Random hr    Protein/Creat Ratio Volume,URN Random mL    Protein (Total), URN 10 mg/dL    Total ZOXWRUE/45W, URN Not calculated 0.05 - 0.08 g/24 hr    Creatinine/Unit, URN 133 mg/dL    UJWJXBJYNW/29F, URN Not calculated 1000 - 2000 mg/24 hr    Protein/Creatinine Ratio <0.1 <0.2   9. LIPID PANEL   Result Value Ref Range    Cholesterol (Total) 157 <200 mg/dL    Triglyceride 77 <621 mg/dL    Cholesterol (HDL) 40 >39 mg/dL    Cholesterol (LDL) 308 <130 mg/dL    Non-HDL Cholesterol 117 0 - 159 mg/dL    Cholesterol/HDL Ratio 3.9     Lipid Panel, Additional Info. (NOTE)          Impression and Plan:  Herbert Robertson is a 29 year old man with ESRD of unknown etiology, now s/p LRT 11/11/12 from his brother at [facility]    1. Renal allograft function. Scr above baseline creatinine 1.4-1.6 without proteinuria; DSA 11/03/13 was negative. Normal UA.   -Will have him increase liquid intake, CsA dose and repeat labs in 1-2 weeks. No indication for biopsy at this time.    2. Immunosuppression.  ATG 5.6mg /kg induction.  Steroid-free regimen.  - Increase cyclosporine to 100mg  AM/75mg  PM, with goal 80-140ng/ml at this time.  -  continue MMF 1g bid  - off tacrolimus (hair loss) and on steroid avoidance protocol from time of transplant.    3. HTN: Acceptable BP here, and reportedly normal at home; Will continue lisinopril 5mg  qhs, advised decrease sodium intake, continue daily exercise, and more regular sleep.  - Will continue home BPs    4. Hypomagnesemia. Normal on current dose.  - continue supplementation with potential to stop at next appt.    5. Fe deficiency anemia. Slightly diminished Hgb in setting of recent viral URI; Will continue on FeSO4 supplementation and follow CBC with recheck of Fe studies at next appt.    6. Health maintenance. Influenza vaccination received autumn 2015. Advised attention to health, including low fat diet, exercise and sleep.     Will recheck labs in 1-2 weeks, then q2 months  RTC 6 months or sooner as indicated.    Herbert Lamas, MD  Department of Medicine - Division of Nephrology & Transplantation  [facility]  Pager [phone]

## 2014-04-08 LAB — BK VIRUS PCR QUANT, URINE: BK Virus Urine Quant: NOT DETECTED Copies/mL

## 2014-04-11 ENCOUNTER — Other Ambulatory Visit (HOSPITAL_BASED_OUTPATIENT_CLINIC_OR_DEPARTMENT_OTHER): Payer: Self-pay

## 2014-04-11 ENCOUNTER — Other Ambulatory Visit (HOSPITAL_COMMUNITY): Payer: Self-pay

## 2014-04-11 DIAGNOSIS — Z940 Kidney transplant status: Secondary | ICD-10-CM

## 2014-04-11 MED ORDER — CYCLOSPORINE 25 MG OR CAPS
ORAL_CAPSULE | ORAL | Status: DC
Start: 2014-04-11 — End: 2014-05-24

## 2014-04-11 NOTE — Telephone Encounter (Signed)
Per order of Dr Assunta FoundBlosser in max, have instructed the patient to change his CSA dose to 100mg /75mg . He requests a new RX be sent to [facility].

## 2014-05-09 ENCOUNTER — Other Ambulatory Visit (HOSPITAL_COMMUNITY): Payer: Self-pay

## 2014-05-09 LAB — CBC, DIFF, EXTERNAL
% Basophils, External: 1
% Eosinophils, External: 3
% Lymphocytes, External: 31
% Monocytes, External: 9
% Neutrophils, External: 56
Basophils, External: 0
Eosinophil Count, External: 0.2
Lymphocyte Count, External: 1.8
Monocytes, External: 0.5
Neutrophils, External: 3.3

## 2014-05-09 LAB — HEPATIC FUNCTION PANEL, EXTERNAL
ALT (GPT), External: 9
AST (GOT), External: 14
Albumin, External: 4.3
Alkaline Phosphatase (Total), External: 135
Bilirubin (Total), External: 0.5
Protein (Total), External: 6.9

## 2014-05-09 LAB — CBC (HEMOGRAM), EXTERNAL
Hematocrit, External: 35.5
Hemoglobin, External: 12
MCH, External: 29.2
MCV, External: 86
Platelet Count, External: 188
WBC, External: 5.8

## 2014-05-09 LAB — BASIC METABOLIC PANEL, EXTERNAL
Calcium, External: 9.4
Carbon Dioxide (Total), External: 19
Chloride, External: 102
Creatinine, External: 1.55
Glucose, External: 99
Potassium, External: 5.5
Sodium, External: 136
Urea Nitrogen, External: 24

## 2014-05-09 LAB — MAGNESIUM, EXTERNAL: Magnesium, External: 1.8

## 2014-05-09 LAB — PHOSPHATE, EXTERNAL: Phosphate, External: 4.9

## 2014-05-09 LAB — COMPREHENSIVE METABOLIC PANEL, EXTERNAL: GFR, Calc, European American, External: 60

## 2014-05-09 LAB — CYCLOSPORINE, EXTERNAL: Cyclosporine By LC_MS, External: 172

## 2014-05-23 ENCOUNTER — Other Ambulatory Visit (HOSPITAL_BASED_OUTPATIENT_CLINIC_OR_DEPARTMENT_OTHER): Payer: Self-pay

## 2014-05-23 ENCOUNTER — Other Ambulatory Visit (HOSPITAL_COMMUNITY): Payer: Self-pay

## 2014-05-23 DIAGNOSIS — Z48298 Encounter for aftercare following other organ transplant: Secondary | ICD-10-CM

## 2014-05-23 DIAGNOSIS — T8690 Unspecified complication of unspecified transplanted organ and tissue: Secondary | ICD-10-CM

## 2014-05-23 DIAGNOSIS — Z940 Kidney transplant status: Secondary | ICD-10-CM

## 2014-05-24 ENCOUNTER — Encounter (HOSPITAL_COMMUNITY): Payer: Self-pay

## 2014-05-24 NOTE — Progress Notes (Signed)
Documented per transplant on 05/24/14

## 2014-05-24 NOTE — Progress Notes (Signed)
No changes are made

## 2014-05-27 ENCOUNTER — Other Ambulatory Visit (HOSPITAL_COMMUNITY): Payer: Self-pay

## 2014-05-27 LAB — HEPATIC FUNCTION PANEL, EXTERNAL
ALT (GPT), External: 11
AST (GOT), External: 15
Albumin, External: 4.5
Alkaline Phosphatase (Total), External: 131
Bilirubin (Total), External: 0.6
Protein (Total), External: 7.2

## 2014-05-27 LAB — CBC, DIFF, EXTERNAL
% Basophils, External: 0
% Eosinophils, External: 2
% Lymphocytes, External: 25
% Monocytes, External: 7
% Neutrophils, External: 66
Basophils, External: 0
Eosinophil Count, External: 0.1
Lymphocyte Count, External: 1.7
Monocytes, External: 0.5
Neutrophils, External: 4.6

## 2014-05-27 LAB — CBC (HEMOGRAM), EXTERNAL
Hematocrit, External: 37.3
Hemoglobin, External: 12.6
MCH, External: 29.3
MCV, External: 87
Platelet Count, External: 209
WBC, External: 6.9

## 2014-05-27 LAB — PHOSPHATE, EXTERNAL: Phosphate, External: 4.8

## 2014-05-27 LAB — BASIC METABOLIC PANEL, EXTERNAL
Calcium, External: 9.5
Carbon Dioxide (Total), External: 19
Chloride, External: 103
Creatinine, External: 1.67
Glucose, External: 100
Potassium, External: 5.7
Sodium, External: 139
Urea Nitrogen, External: 25

## 2014-05-27 LAB — CYCLOSPORINE, EXTERNAL: Cyclosporine By LC_MS, External: 156

## 2014-05-27 LAB — MAGNESIUM, EXTERNAL: Magnesium, External: 2

## 2014-05-27 LAB — COMPREHENSIVE METABOLIC PANEL, EXTERNAL: GFR, Calc, European American, External: 55

## 2014-06-07 ENCOUNTER — Encounter (HOSPITAL_COMMUNITY): Payer: Self-pay

## 2014-06-07 ENCOUNTER — Other Ambulatory Visit (HOSPITAL_COMMUNITY): Payer: Self-pay

## 2014-06-07 NOTE — Progress Notes (Signed)
Documented per transplant on 06/07/14

## 2014-06-30 ENCOUNTER — Other Ambulatory Visit (HOSPITAL_COMMUNITY): Payer: Self-pay

## 2014-06-30 LAB — PROTEIN/CREATININE RATIO, URN, EXTERNAL: Creatinine/Unit, URN, External: 65.8

## 2014-06-30 LAB — UA MACROSCOPIC W/REFLEX CULTURE, EXTERNAL
Bilirubin Qual, URN, External: NEGATIVE
Glucose Qual, URN, External: NEGATIVE
Ketones, URN, External: NEGATIVE
Leukocyte Esterase, URN, External: NEGATIVE
Nitrite, URN, External: NEGATIVE
Occult Blood, URN, External: NEGATIVE
Protein (Alb Semi Quant), URN, External: NEGATIVE
Specific Gravity, URN, External: 1.012
Urobilinogen, URN, External: 0.2
pH, URN, External: 6

## 2014-06-30 LAB — CBC, DIFF, EXTERNAL
% Basophils, External: 1
% Eosinophils, External: 1
% Lymphocytes, External: 23
% Monocytes, External: 5
% Neutrophils, External: 70
Basophils, External: 0
Eosinophil Count, External: 0.1
Lymphocyte Count, External: 1.2
Monocytes, External: 0.3
Neutrophils, External: 3.9

## 2014-06-30 LAB — COMPREHENSIVE METABOLIC PANEL, EXTERNAL: GFR, Calc, European American, External: 61

## 2014-06-30 LAB — UA MICROSCOPIC W/REFLEX CULTURE, EXTERNAL
Bacteria, URN, External: NONE SEEN
Epith Cells_Squamous, URN, External: NONE SEEN

## 2014-06-30 LAB — CBC (HEMOGRAM), EXTERNAL
Hematocrit, External: 40
Hemoglobin, External: 12.8
MCH, External: 28.6
MCV, External: 90
Platelet Count, External: 183
WBC, External: 5.5

## 2014-06-30 LAB — HEPATIC FUNCTION PANEL, EXTERNAL
ALT (GPT), External: 12
AST (GOT), External: 19
Albumin, External: 4.8
Alkaline Phosphatase (Total), External: 130
Bilirubin (Total), External: 0.4
Protein (Total), External: 7.8

## 2014-06-30 LAB — BASIC METABOLIC PANEL, EXTERNAL
Calcium, External: 9.5
Carbon Dioxide (Total), External: 19
Chloride, External: 103
Creatinine, External: 1.54
Glucose, External: 101
Potassium, External: 5.6
Sodium, External: 139
Urea Nitrogen, External: 27

## 2014-06-30 LAB — MAGNESIUM, EXTERNAL: Magnesium, External: 2.1

## 2014-06-30 LAB — CYCLOSPORINE, EXTERNAL: Cyclosporine By LC_MS, External: 62

## 2014-06-30 LAB — PHOSPHATE, EXTERNAL: Phosphate, External: 3.7

## 2014-07-22 ENCOUNTER — Other Ambulatory Visit (HOSPITAL_COMMUNITY): Payer: Self-pay

## 2014-07-22 ENCOUNTER — Encounter (HOSPITAL_COMMUNITY): Payer: Self-pay

## 2014-07-22 NOTE — Progress Notes (Signed)
Documented per transplant on 07/22/14

## 2014-08-23 ENCOUNTER — Other Ambulatory Visit (HOSPITAL_COMMUNITY): Payer: Self-pay

## 2014-08-23 LAB — CBC (HEMOGRAM), EXTERNAL
Hematocrit, External: 37.2
Hemoglobin, External: 12.4
MCH, External: 28.4
MCV, External: 85
Platelet Count, External: 194
WBC, External: 5.4

## 2014-08-23 LAB — BASIC METABOLIC PANEL, EXTERNAL
Calcium, External: 9.3
Carbon Dioxide (Total), External: 18
Chloride, External: 104
Creatinine, External: 1.48
Glucose, External: 92
Potassium, External: 5.7
Sodium, External: 137
Urea Nitrogen, External: 23

## 2014-08-23 LAB — UA MACROSCOPIC W/REFLEX CULTURE, EXTERNAL
Bilirubin Qual, URN, External: NEGATIVE
Glucose Qual, URN, External: NEGATIVE
Ketones, URN, External: NEGATIVE
Leukocyte Esterase, URN, External: NEGATIVE
Nitrite, URN, External: NEGATIVE
Occult Blood, URN, External: NEGATIVE
Protein (Alb Semi Quant), URN, External: NEGATIVE
Specific Gravity, URN, External: 1.01
Urobilinogen, URN, External: 0.2
pH, URN, External: 6

## 2014-08-23 LAB — COMPREHENSIVE METABOLIC PANEL, EXTERNAL: GFR, Calc, European American, External: 64

## 2014-08-23 LAB — UA MICROSCOPIC W/REFLEX CULTURE, EXTERNAL
Bacteria, URN, External: NONE SEEN
Epith Cells_Squamous, URN, External: NONE SEEN

## 2014-08-23 LAB — HEPATIC FUNCTION PANEL, EXTERNAL
ALT (GPT), External: 93
AST (GOT), External: 143
Albumin, External: 4.4
Alkaline Phosphatase (Total), External: 109
Bilirubin (Total), External: 0.6
Protein (Total), External: 6.8

## 2014-08-23 LAB — CBC, DIFF, EXTERNAL
% Basophils, External: 1
% Eosinophils, External: 4
% Lymphocytes, External: 25
% Monocytes, External: 6
% Neutrophils, External: 64
Basophils, External: 0
Eosinophil Count, External: 0.2
Lymphocyte Count, External: 1.3
Monocytes, External: 0.3
Neutrophils, External: 3.6

## 2014-08-23 LAB — PHOSPHATE, EXTERNAL: Phosphate, External: 3.7

## 2014-08-23 LAB — CYCLOSPORINE, EXTERNAL: Cyclosporine By LC_MS, External: 57

## 2014-08-23 LAB — PROTEIN/CREATININE RATIO, URN, EXTERNAL
Creatinine/Unit, URN, External: 69.6
Protein (Total), URN, External: 11.4
Protein/Creatinine Ratio, External: 164

## 2014-08-23 LAB — MAGNESIUM, EXTERNAL: Magnesium, External: 1.7

## 2014-09-06 ENCOUNTER — Encounter (HOSPITAL_COMMUNITY): Payer: Self-pay

## 2014-09-06 ENCOUNTER — Other Ambulatory Visit (HOSPITAL_COMMUNITY): Payer: Self-pay

## 2014-09-20 ENCOUNTER — Other Ambulatory Visit (HOSPITAL_COMMUNITY): Payer: Self-pay

## 2014-09-20 LAB — CBC, DIFF, EXTERNAL
% Basophils, External: 0
% Eosinophils, External: 1
% Lymphocytes, External: 26
% Monocytes, External: 7
% Neutrophils, External: 66
Basophils, External: 0
Eosinophil Count, External: 0.1
Lymphocyte Count, External: 1.3
Monocytes, External: 0.4
Neutrophils, External: 3.3

## 2014-09-20 LAB — HEPATIC FUNCTION PANEL, EXTERNAL
ALT (GPT), External: 12
AST (GOT), External: 18
Albumin, External: 4.5
Alkaline Phosphatase (Total), External: 156
Bilirubin (Total), External: 0.4
Protein (Total), External: 6.9

## 2014-09-20 LAB — BASIC METABOLIC PANEL, EXTERNAL
Calcium, External: 8.9
Carbon Dioxide (Total), External: 16
Chloride, External: 103
Creatinine, External: 1.7
Glucose, External: 102
Potassium, External: 5.4
Sodium, External: 137
Urea Nitrogen, External: 29

## 2014-09-20 LAB — CBC (HEMOGRAM), EXTERNAL
Hematocrit, External: 38.5
Hemoglobin, External: 12.6
MCH, External: 27.8
MCV, External: 85
Platelet Count, External: 169
WBC, External: 5

## 2014-09-20 LAB — MAGNESIUM, EXTERNAL: Magnesium, External: 2.4

## 2014-09-20 LAB — PHOSPHATE, EXTERNAL: Phosphate, External: 4.5

## 2014-09-20 LAB — CYCLOSPORINE, EXTERNAL: Cyclosporine By LC_MS, External: 81

## 2014-09-20 LAB — COMPREHENSIVE METABOLIC PANEL, EXTERNAL: GFR, Calc, European American, External: 54

## 2014-10-17 ENCOUNTER — Other Ambulatory Visit (HOSPITAL_BASED_OUTPATIENT_CLINIC_OR_DEPARTMENT_OTHER): Payer: Self-pay

## 2014-10-17 DIAGNOSIS — Z940 Kidney transplant status: Secondary | ICD-10-CM

## 2014-10-17 MED ORDER — FERROUS SULFATE 324 (65 FE) MG OR TBEC
324.0000 mg | DELAYED_RELEASE_TABLET | Freq: Every day | ORAL | Status: DC
Start: 2014-10-17 — End: 2018-09-23

## 2014-10-17 MED ORDER — MAGNESIUM OXIDE -MG SUPPLEMENT 400 (240 MG) MG OR TABS
400.0000 mg | ORAL_TABLET | Freq: Two times a day (BID) | ORAL | Status: DC
Start: 2014-10-17 — End: 2018-09-23

## 2014-10-17 NOTE — Telephone Encounter (Signed)
Medical info on 10/17/2014 for patient Herbert Robertson [id number]   Pt called to review his labs and request iron and mag refills. He says he will be going to [place] in November and then back to [place] in January.

## 2014-12-02 ENCOUNTER — Other Ambulatory Visit: Payer: Self-pay

## 2014-12-08 ENCOUNTER — Other Ambulatory Visit (HOSPITAL_COMMUNITY): Payer: Self-pay

## 2014-12-08 LAB — CBC, DIFF, EXTERNAL
% Basophils, External: 1
% Eosinophils, External: 1
% Lymphocytes, External: 24
% Monocytes, External: 6
% Neutrophils, External: 68
Basophils, External: 0
Eosinophil Count, External: 0.1
Lymphocyte Count, External: 1.6
Monocytes, External: 0.4
Neutrophils, External: 4.4

## 2014-12-08 LAB — BASIC METABOLIC PANEL, EXTERNAL
Calcium, External: 9.8
Carbon Dioxide (Total), External: 18
Chloride, External: 100
Creatinine, External: 1.68
Glucose, External: 100
Potassium, External: 5.5
Sodium, External: 137
Urea Nitrogen, External: 33

## 2014-12-08 LAB — UA MICROSCOPIC W/REFLEX CULTURE, EXTERNAL: Epith Cells_Squamous, URN, External: NONE SEEN

## 2014-12-08 LAB — HEPATIC FUNCTION PANEL, EXTERNAL
ALT (GPT), External: 13
AST (GOT), External: 15
Albumin, External: 4.8
Alkaline Phosphatase (Total), External: 141
Bilirubin (Total), External: 0.7
Protein (Total), External: 7.7

## 2014-12-08 LAB — CBC (HEMOGRAM), EXTERNAL
Hematocrit, External: 39.7
Hemoglobin, External: 13.3
MCH, External: 39.7
MCV, External: 86
Platelet Count, External: 187
WBC, External: 6.5

## 2014-12-08 LAB — MAGNESIUM, EXTERNAL: Magnesium, External: 2

## 2014-12-08 LAB — UA MACROSCOPIC W/REFLEX CULTURE, EXTERNAL
Bilirubin Qual, URN, External: NEGATIVE
Glucose Qual, URN, External: NEGATIVE
Ketones, URN, External: NEGATIVE
Leukocyte Esterase, URN, External: NEGATIVE
Nitrite, URN, External: NEGATIVE
Occult Blood, URN, External: NEGATIVE
Protein (Alb Semi Quant), URN, External: NEGATIVE
Specific Gravity, URN, External: 1.011
Urobilinogen, URN, External: 0.2
pH, URN, External: 6

## 2014-12-08 LAB — CYCLOSPORINE, EXTERNAL: Cyclosporine By LC_MS, External: 95

## 2014-12-08 LAB — PROTEIN/CREATININE RATIO, URN, EXTERNAL
Creatinine/Unit, URN, External: 67.7
Protein (Total), URN, External: 5.4
Protein/Creatinine Ratio, External: 80

## 2014-12-08 LAB — COMPREHENSIVE METABOLIC PANEL, EXTERNAL: GFR, Calc, European American, External: 55

## 2014-12-08 LAB — PHOSPHATE, EXTERNAL: Phosphate, External: 3.9

## 2014-12-09 ENCOUNTER — Other Ambulatory Visit (HOSPITAL_BASED_OUTPATIENT_CLINIC_OR_DEPARTMENT_OTHER): Payer: Self-pay

## 2014-12-09 DIAGNOSIS — Z940 Kidney transplant status: Secondary | ICD-10-CM

## 2014-12-09 MED ORDER — MYCOPHENOLATE MOFETIL 500 MG OR TABS
1000.0000 mg | ORAL_TABLET | Freq: Two times a day (BID) | ORAL | Status: DC
Start: 2014-12-09 — End: 2015-06-28

## 2014-12-09 NOTE — Telephone Encounter (Signed)
Medical info on 12/09/2014 for patient Herbert Robertson [id number]   Pt called to request a new mmf RX 90 days worth. He is leaving for [place] on 11/24 and returning the first week of January. I told him that if he doesn't come back to [place] in January then he will need to find a doctor in [place]. I [facility] [place] to see if there are any travel precautions for him. He understands and agrees with the plans.

## 2015-02-01 ENCOUNTER — Other Ambulatory Visit (HOSPITAL_BASED_OUTPATIENT_CLINIC_OR_DEPARTMENT_OTHER): Payer: Self-pay | Admitting: Nephrology

## 2015-02-01 DIAGNOSIS — I10 Essential (primary) hypertension: Secondary | ICD-10-CM

## 2015-02-07 MED ORDER — LISINOPRIL 5 MG OR TABS
ORAL_TABLET | ORAL | Status: DC
Start: 2015-02-07 — End: 2015-11-29

## 2015-04-18 ENCOUNTER — Other Ambulatory Visit (HOSPITAL_BASED_OUTPATIENT_CLINIC_OR_DEPARTMENT_OTHER): Payer: Self-pay

## 2015-04-18 DIAGNOSIS — Z940 Kidney transplant status: Secondary | ICD-10-CM

## 2015-04-25 ENCOUNTER — Other Ambulatory Visit (HOSPITAL_COMMUNITY): Payer: Self-pay

## 2015-04-25 LAB — UA MICROSCOPIC W/REFLEX CULTURE, EXTERNAL
Bacteria, URN, External: NONE SEEN
Epith Cells_Squamous, URN, External: NONE SEEN

## 2015-04-25 LAB — CBC, DIFF, EXTERNAL
% Basophils, External: 1
% Eosinophils, External: 2
% Lymphocytes, External: 29
% Monocytes, External: 8
% Neutrophils, External: 60
Basophils, External: 0
Eosinophil Count, External: 0.1
Lymphocyte Count, External: 1.8
Monocytes, External: 0.5
Neutrophils, External: 3.9

## 2015-04-25 LAB — PHOSPHATE, EXTERNAL: Phosphate, External: 4

## 2015-04-25 LAB — CYCLOSPORINE, EXTERNAL: Cyclosporine By LC_MS, External: 88

## 2015-04-25 LAB — CBC (HEMOGRAM), EXTERNAL
Hematocrit, External: 35.7
Hemoglobin, External: 12.5
MCH, External: 29.1
MCV, External: 83
Platelet Count, External: 189
WBC, External: 6.4

## 2015-04-25 LAB — HEPATIC FUNCTION PANEL, EXTERNAL
ALT (GPT), External: 9
AST (GOT), External: 16
Albumin, External: 4.9
Alkaline Phosphatase (Total), External: 91
Bilirubin (Total), External: 0.7
Protein (Total), External: 7.8

## 2015-04-25 LAB — PROTEIN/CREATININE RATIO, URN, EXTERNAL
Creatinine/Unit, URN, External: 70.5
Protein (Total), URN, External: 5.7
Protein/Creatinine Ratio, External: 81

## 2015-04-25 LAB — UA MACROSCOPIC W/REFLEX CULTURE, EXTERNAL
Bilirubin Qual, URN, External: NEGATIVE
Glucose Qual, URN, External: NEGATIVE
Ketones, URN, External: NEGATIVE
Leukocyte Esterase, URN, External: NEGATIVE
Nitrite, URN, External: NEGATIVE
Occult Blood, URN, External: NEGATIVE
Protein (Alb Semi Quant), URN, External: NEGATIVE
Specific Gravity, URN, External: 1.012
Urobilinogen, URN, External: 0.2
pH, URN, External: 6

## 2015-04-25 LAB — BASIC METABOLIC PANEL, EXTERNAL
Calcium, External: 9.8
Carbon Dioxide (Total), External: 18
Chloride, External: 103
Creatinine, External: 1.55
Glucose, External: 99
Potassium, External: 5.3
Sodium, External: 138
Urea Nitrogen, External: 29

## 2015-04-25 LAB — COMPREHENSIVE METABOLIC PANEL, EXTERNAL: GFR, Calc, European American, External: UNDETERMINED

## 2015-04-25 LAB — MAGNESIUM, EXTERNAL: Magnesium, External: 1.8

## 2015-05-31 ENCOUNTER — Ambulatory Visit: Payer: 59 | Attending: Nephrology | Admitting: Nephrology

## 2015-05-31 ENCOUNTER — Other Ambulatory Visit (HOSPITAL_BASED_OUTPATIENT_CLINIC_OR_DEPARTMENT_OTHER): Payer: Self-pay

## 2015-05-31 VITALS — BP 137/64 | HR 77 | Temp 98.8°F | Ht 66.0 in | Wt 132.1 lb

## 2015-05-31 DIAGNOSIS — Z48298 Encounter for aftercare following other organ transplant: Secondary | ICD-10-CM | POA: Insufficient documentation

## 2015-05-31 DIAGNOSIS — Z940 Kidney transplant status: Secondary | ICD-10-CM | POA: Insufficient documentation

## 2015-05-31 DIAGNOSIS — D849 Immunodeficiency, unspecified: Secondary | ICD-10-CM

## 2015-05-31 DIAGNOSIS — D899 Disorder involving the immune mechanism, unspecified: Secondary | ICD-10-CM | POA: Insufficient documentation

## 2015-05-31 DIAGNOSIS — I10 Essential (primary) hypertension: Secondary | ICD-10-CM

## 2015-05-31 LAB — CBC, DIFF
% Basophils: 1 %
% Eosinophils: 1 %
% Immature Granulocytes: 1 %
% Lymphocytes: 14 %
% Monocytes: 6 %
% Neutrophils: 77 %
% Nucleated RBC: 0 %
Absolute Eosinophil Count: 0.08 10*3/uL (ref 0.00–0.50)
Absolute Lymphocyte Count: 1.22 10*3/uL (ref 1.00–4.80)
Basophils: 0.05 10*3/uL (ref 0.00–0.20)
Hematocrit: 34 % — ABNORMAL LOW (ref 38–50)
Hemoglobin: 11.5 g/dL — ABNORMAL LOW (ref 13.0–18.0)
Immature Granulocytes: 0.05 10*3/uL (ref 0.00–0.05)
MCH: 28.6 pg (ref 27.3–33.6)
MCHC: 33.8 g/dL (ref 32.2–36.5)
MCV: 85 fL (ref 81–98)
Monocytes: 0.48 10*3/uL (ref 0.00–0.80)
Neutrophils: 6.79 10*3/uL (ref 1.80–7.00)
Nucleated RBC: 0 10*3/uL
Platelet Count: 164 10*3/uL (ref 150–400)
RBC: 4.02 10*6/uL — ABNORMAL LOW (ref 4.40–5.60)
RDW-CV: 12.1 % (ref 11.6–14.4)
WBC: 8.67 10*3/uL (ref 4.3–10.0)

## 2015-05-31 LAB — COMPREHENSIVE METABOLIC PANEL
ALT (GPT): 10 U/L (ref 10–64)
AST (GOT): 13 U/L (ref 9–38)
Albumin: 4.5 g/dL (ref 3.5–5.2)
Alkaline Phosphatase (Total): 91 U/L (ref 35–109)
Anion Gap: 9 (ref 4–12)
Bilirubin (Total): 0.4 mg/dL (ref 0.2–1.3)
Calcium: 9.2 mg/dL (ref 8.9–10.2)
Carbon Dioxide, Total: 22 meq/L (ref 22–32)
Chloride: 105 meq/L (ref 98–108)
Creatinine: 1.62 mg/dL — ABNORMAL HIGH (ref 0.51–1.18)
GFR, Calc, African American: >60 mL/min/{1.73_m2}
GFR, Calc, European American: 51 mL/min/{1.73_m2}
Glucose: 109 mg/dL (ref 62–125)
Potassium: 4.9 meq/L (ref 3.6–5.2)
Protein (Total): 7.7 g/dL (ref 6.0–8.2)
Sodium: 136 meq/L (ref 135–145)
Urea Nitrogen: 23 mg/dL — ABNORMAL HIGH (ref 8–21)

## 2015-05-31 LAB — PROTEIN/CREATININE RATIO, TIMED URINE
Creatinine/24H, Urine: UNDETERMINED mg/(24.h) (ref 1000–2000)
Creatinine/Unit, Urine: 69 mg/dL
Protein (Total), Urine: 5 mg/dL
Protein/Creatinine Ratio: <0.1 (ref <0.2)
Total Protein/24H, Urine: UNDETERMINED g/d (ref 0.05–0.08)

## 2015-05-31 LAB — URINALYSIS COMPLETE, URN
Bacteria, URN: NONE SEEN
Bilirubin (Qual), URN: NEGATIVE
Epith Cells_Renal/Trans,URN: NEGATIVE /HPF
Epith Cells_Squamous, URN: NEGATIVE /LPF
Glucose Qual, URN: NEGATIVE mg/dL
Ketones, URN: NEGATIVE mg/dL
Leukocyte Esterase, URN: NEGATIVE
Nitrite, URN: NEGATIVE
Occult Blood, URN: NEGATIVE
Protein (Alb Semiquant), URN: NEGATIVE mg/dL
RBC, URN: NEGATIVE /HPF
Specific Gravity, URN: 1.006 g/mL (ref 1.002–1.027)
WBC, URN: NEGATIVE /HPF

## 2015-05-31 LAB — CYCLOSPORINE BY LCMSMS: Cyclosporine By Lc_Ms: 95 ng/mL

## 2015-05-31 LAB — LAB ADD ON ORDER

## 2015-05-31 LAB — LIPID PANEL
Cholesterol (LDL): 79 mg/dL (ref <130)
Cholesterol/HDL Ratio: 3.3
HDL Cholesterol: 42 mg/dL (ref >39)
Non-HDL Cholesterol: 95 mg/dL (ref 0–159)
Total Cholesterol: 137 mg/dL (ref <200)
Triglyceride: 79 mg/dL (ref <150)

## 2015-05-31 LAB — FERRITIN: Ferritin: 88 ng/mL (ref 20–230)

## 2015-05-31 LAB — IRON BINDING CAPACITY (W/IRON, TRANSFERRIN & TRANSF SAT)
Iron, SRM: 54 ug/dL (ref 31–171)
Total Iron Binding Capacity: 333 ug/dL (ref 250–460)
Transferrin Saturation: 16 % (ref 15–50)
Transferrin: 238 mg/dL (ref 180–329)

## 2015-05-31 LAB — MAGNESIUM: Magnesium: 1.5 mg/dL — ABNORMAL LOW (ref 1.8–2.4)

## 2015-05-31 LAB — PHOSPHATE: Phosphate: 2.9 mg/dL (ref 2.5–4.5)

## 2015-05-31 LAB — PARATHYROID HORMONE: Parathyroid Hormone: 58 pg/mL (ref 12–88)

## 2015-05-31 NOTE — Progress Notes (Signed)
Transplant Nephrology Outpatient Initial visit  Encounter Date: 05/31/2015    Chief Complaint: 30 year old man with history of LRT 11/11/12 from his brother (Ht 5'4", Wt 170lbs), here to discuss management, including immunosuppression. He has concerns about sore throat and gum pain for last 4 days.     History of Present Illness:  Herbert Robertson is a 30 year old man with ESRD of unclear etiology - serologic workup was negative, who underwent living related kidney transplant from his brother at the [place] [facility] [place] November 11, 2012. He received ATG 5.4mg /kg and was started on an FK/MMF regimen (steroid-avoidance).  An early biopsy 11/27/12 for elevated creatinine did not show any evidence of rejection, was complicated by bladder clots requiring temporary foley placement.  He was eventually changed to cyclosporine / MMF at his request due to hair loss attributed to tacrolimus.  He has continued on this regimen with fluctuating creatinine - sometimes as high as 1.7 or 1.8, with baseline creatinine of 1.4-1.6. He has not been biopsied since.  He has not had any proteinuria or cells in the urine.  His brother is doing well, and he reportedly has a creatinine of 1.3.    He returns on his own today with symptoms of 4-5 days of improving sore throat, gum pain and left submandibular swollen lymph node. He denies fevers, chills, sweats, cough, shortness of breath or other ENT symptoms. He has been taking his immunosuppression meds without missing doses, but has not been taking Fe, Mg or Vit D recently. He has been intermittently exercising (weight lifting, core exercises, cricket in evenings) and eating high protein diet (red meat). 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    Review of Systems -   Otherwise negative except for noted in the HPI.      Past Medical History  I personally reviewed and confirmed the ROS, past medical history, past surgical  history, family history and social history in the record with the patient today.      Patient Active Problem List   Diagnosis   . Immunosuppression (HCC)   . 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 Herpetic gingivostomatitis   . CMV donor positive, recipient positive   . Lung nodule seen on imaging study   . HTN, goal below 130/80        Past Medical History   Diagnosis Date   . Chronic kidney disease, stage V 09/22/2013     CKD and severe hypertension since 2006, unknown etiology. Preemptive kidney transplant 2014    . Hypertension 11/15/2012   . 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 ATG induction 5.4mg /kg (300mg ) Biopsy for increasing creatinine 11/27/12, no rejection. Baseline creatinine 1.4         Past Surgical History   Procedure Laterality Date   . Renal altrnsplj impltj grf w/o rcp nephrectomy          Review of patient's allergies indicates:  Allergies   Allergen Reactions   . Sulfa Antibiotics      Other reaction(s): Unknown Reaction        Current Outpatient Prescriptions   Medication Sig Dispense Refill   . Cholecalciferol 1000 UNITS Oral Tab Take 1 tablet (1,000 Units) by mouth daily. 90 tablet 3   . CYCLOSPORINE OR 100 mg AM and 75 mg PM     . Ferrous Sulfate 324 (65 FE) MG Oral Tab  EC Take 1 tablet (324 mg) by mouth daily. 90 tablet 3   . Lisinopril 5 MG Oral Tab Take 1 tablet by mouth at  bedtime 90 tablet 3   . Magnesium Oxide 400 (240 MG) MG Oral Tab Take 1 tablet (400 mg) by mouth 2 times a day. 180 tablet 3   . Mycophenolate Mofetil 500 MG Oral Tab Take 2 tablets (1,000 mg) by mouth 2 times a day. 360 tablet 1     No current facility-administered medications for this visit.        Medication Notes:   I have reviewed the above list of medicines with the patient and it is accurate.     Family History   Problem Relation Age of Onset   . Diabetes Father    . Heart Disease No Hx Of    . Cancer No Hx Of    . Renal Disease No Hx Of    .  Diabetes Paternal Grandmother         Social History     Social History   . Marital Status: Single     Spouse Name: N/A   . Number of Children: N/A   . Years of Education: N/A     Occupational History   . Not on file.     Social History Main Topics   . Smoking status: Never Smoker    . Smokeless tobacco: Never Used   . Alcohol Use: No   . Drug Use: No   . Sexual Activity: Not on file     Other Topics Concern   . Not on file     Social History Narrative    Moved from [place] to [place] in 2015 after completing his studies.  Lives with sister and brother.        Physical Exam:   Filed Vitals:    05/31/15 1022   BP: 137/64   Pulse: 77   Temp: 98.8 F (37.1 C)   TempSrc: Temporal   Height:  (1.676 m)   Weight: 132 lb 0.9 oz (59.9 kg)   SpO2: 100%     Estimated body mass index is 21.32 kg/(m^2) as calculated from the following:    Height as of this encounter:  (1.676 m).    Weight as of this encounter: 132 lb 0.9 oz (59.9 kg).   General: 30 year old male in no acute distress.  HEENT: Anicteric sclerae, moist mucosa with mild upper gum erythema without exudate. Mild gingival hypertrophy  NECK: Supple, without cervical or supraclavicular lymphadenopathy or jugular venous distention.   LUNGS: Clear to auscultation, without wheeze or rales.  Cardiovascular: Regular rate or rhthym, without murmur, gallop or rub.  Abdominal: Soft, Nontender, nondistended, normal active bowel sounds. Incision is well healed. Allograft is nontender in Right lower quadrant, and without bruit.  Extremities: No clubbing, cyanosis, or edema.  Neurologic: Grossly normal, without tremor. Alert & oriented x4.  Skin: Normal without rash.          RESULTS REVIEWED.  Orders Only on 05/31/15   1. PHOSPHATE   Result Value Ref Range    Phosphate 2.9 2.5 - 4.5 mg/dL   2. MAGNESIUM   Result Value Ref Range    Magnesium 1.5 (L) 1.8 - 2.4 mg/dL   3. COMPREHENSIVE METABOLIC PANEL   Result Value Ref Range    Sodium 136 135 - 145 meq/L    Potassium 4.9  3.6 - 5.2 meq/L    Chloride  105 98 - 108 meq/L    Carbon Dioxide, Total 22 22 - 32 meq/L    Anion Gap 9 4 - 12    Glucose 109 62 - 125 mg/dL    Urea Nitrogen 23 (H) 8 - 21 mg/dL    Creatinine 0.86 (H) 0.51 - 1.18 mg/dL    Protein (Total) 7.7 6.0 - 8.2 g/dL    Albumin 4.5 3.5 - 5.2 g/dL    Bilirubin (Total) 0.4 0.2 - 1.3 mg/dL    Calcium 9.2 8.9 - 57.8 mg/dL    AST (GOT) 13 9 - 38 U/L    Alkaline Phosphatase (Total) 91 35 - 109 U/L    ALT (GPT) 10 10 - 64 U/L    GFR, Calc, European American 51 mL/min/[1.73_m2]    GFR, Calc, African American >60 mL/min/[1.73_m2]    GFR, Information       Calculated GFR in mL/min/1.73 m2 by MDRD equation.  Inaccurate with changing renal function.  See http://depts.ThisTune.it.html   4. URINE PROTEIN/CREATININE RATIO   Result Value Ref Range    Protein/Creat Ratio Int, URN Random h    Protein/Creat Ratio Volume,URN Random mL    Protein (Total), URN 5 mg/dL    Total IONGEXB/28U, URN Unable to calculate value on a random specimen. 0.05 - 0.08 g/d    Creatinine/Unit, URN 69 mg/dL    XLKGMWNUUV/25D, URN Unable to calculate value on a random specimen. 1000 - 2000 mg/(24.h)    Protein/Creatinine Ratio <0.1 <0.2   5. Urinalysis Complete, URN   Result Value Ref Range    Color, URN Pale     Clarity, URN Clear     Specific Gravity, URN 1.006 1.002 - 1.027 g/mL    pH, URN < or =5.0 5.0 - 8.0    Protein (Alb Semiquant), URN Negative NRN mg/dL    Glucose Qual, URN Negative NRN mg/dL    Ketones, URN Negative NRN mg/dL    Bilirubin (Qual), URN Negative NRN    Occult Blood, URN Negative NRN    Nitrite, URN Negative NRN    Leukocyte Esterase, URN Negative NRN    Urobilinogen, URN 0.1-1.9 URONML [Ehrlich'U]    Comments for Macroscopic, URN None     WBC, URN 0-5(NEG) Z5NEG /[HPF]    RBC, URN 0-2(NEG) Z2NEG /[HPF]    Bacteria, URN Not Seen NOSEEN    Epith Cells_Squamous, URN 0-5(NEG) LT6 /[LPF]    Epith Cells_Renal/Trans,URN <3(NEG) LESS3 /[HPF]    Comments For Microscopic, URN  None NONE    Mucus, URN Present (A) NOSEEN /[LPF]   6. CBC, DIFF   Result Value Ref Range    WBC 8.67 4.3 - 10.0 10*3/uL    RBC 4.02 (L) 4.40 - 5.60 10*6/uL    Hemoglobin 11.5 (L) 13.0 - 18.0 g/dL    Hematocrit 34 (L) 38 - 50 %    MCV 85 81 - 98 fL    MCH 28.6 27.3 - 33.6 pg    MCHC 33.8 32.2 - 36.5 g/dL    Platelet Count 664 403 - 400 10*3/uL    RDW-CV 12.1 11.6 - 14.4 %    % Neutrophils 77 %    % Lymphocytes 14 %    % Monocytes 6 %    % Eosinophils 1 %    % Basophils 1 %    % Immature Granulocytes 1 %    Neutrophils 6.79 1.80 - 7.00 10*3/uL    Absolute Lymphocyte Count 1.22 1.00 - 4.80 10*3/uL  Monocytes 0.48 0.00 - 0.80 10*3/uL    Absolute Eosinophil Count 0.08 0.00 - 0.50 10*3/uL    Basophils 0.05 0.00 - 0.20 10*3/uL    Immature Granulocytes 0.05 0.00 - 0.05 10*3/uL    Nucleated RBC 0.00 0.00 10*3/uL    % Nucleated RBC 0 %   7. LIPID PANEL   Result Value Ref Range    Cholesterol (Total) 137 <200 mg/dL    Triglyceride 79 <604<150 mg/dL    Cholesterol (HDL) 42 >39 mg/dL    Cholesterol (LDL) 79 <130 mg/dL    Non-HDL Cholesterol 95 0 - 159 mg/dL    Cholesterol/HDL Ratio 3.3     Lipid Panel, Additional Info. (NOTE)    8. PARATHYROID HORMONE   Result Value Ref Range    Parathyroid Hormone 58 12 - 88 pg/mL         Impression and Plan:  Mr. Herbert Robertson is a 30 year old man with ESRD of unknown etiology, now s/p LRT 11/11/12 from his brother at [facility] [place].    * Viral URI - improving symptoms; advised continued attention to health with expected recovery within days.    1. Renal allograft function. Scr at baseline creatinine (1.4-1.6mg /dl) without proteinuria; DSA 11/03/13 was negative. Normal UA.   -Will continue high liquid intake. No indication for biopsy at this time.    2. Immunosuppression.  ATG 5.6mg /kg induction.  Steroid-free regimen.   - continue cyclosporine 100mg  AM/75mg  PM, with goal 60-100ng/ml; level pending.  - continue MMF 1g bid  - off tacrolimus (hair loss) and on steroid avoidance protocol from time of  transplant.    3. HTN: High normal BP, and reportedly normal at home; Will continue lisinopril 5mg  qhs, advised decrease sodium intake, continue daily exercise.    4. Hypomagnesemia. Low since he is not taking supplement. Reviewed Mg rich foods and advised to restart Mg oxide supplement 400mg  BID.     5. Fe deficiency anemia. Decreased Hgb, and has not been taking FeSO4 supplement for last weeks; follow CBC with recheck of Fe studies today.    6. Health maintenance. Influenza vaccination annually. Advised attention to health, including low fat diet, exercise and sleep. Max dietary protein intake 80gm/day without supplement.    Will recheck labs q2 months with plans for him to [facility] within 3 months. I will remain available for any consultation/questions, and am willing to see Mukarram if he moves back to [place].    Jarome Lamashris Blosser, MD  Department of Medicine - Division of Nephrology & Transplantation  [facility]  Pager [phone]

## 2015-06-02 LAB — VITAMIN D (25 HYDROXY)
Vit D (25_Hydroxy) Total: 33.9 ng/mL (ref 20.1–50.0)
Vitamin D2 (25_Hydroxy): 4.8 ng/mL
Vitamin D3 (25_Hydroxy): 29.1 ng/mL

## 2015-06-02 LAB — BK VIRUS PCR QUANT, URINE: BK Virus Urine Quant: NOT DETECTED {copies}/mL

## 2015-06-28 ENCOUNTER — Other Ambulatory Visit (HOSPITAL_BASED_OUTPATIENT_CLINIC_OR_DEPARTMENT_OTHER): Payer: Self-pay | Admitting: Unknown Physician Specialty

## 2015-06-28 DIAGNOSIS — Z940 Kidney transplant status: Secondary | ICD-10-CM

## 2015-06-30 MED ORDER — CYCLOSPORINE 25 MG OR CAPS
ORAL_CAPSULE | ORAL | 1 refills | Status: DC
Start: 2015-06-30 — End: 2015-11-01

## 2015-06-30 MED ORDER — MYCOPHENOLATE MOFETIL 500 MG OR TABS
1000.0000 mg | ORAL_TABLET | Freq: Two times a day (BID) | ORAL | 1 refills | Status: DC
Start: 2015-06-30 — End: 2015-11-01

## 2015-07-18 ENCOUNTER — Encounter (HOSPITAL_BASED_OUTPATIENT_CLINIC_OR_DEPARTMENT_OTHER): Payer: Self-pay | Admitting: Unknown Physician Specialty

## 2015-07-25 ENCOUNTER — Telehealth (HOSPITAL_BASED_OUTPATIENT_CLINIC_OR_DEPARTMENT_OTHER): Payer: Self-pay | Admitting: Unknown Physician Specialty

## 2015-07-25 NOTE — Telephone Encounter (Signed)
Received VM from Lupita Leashonna at [facility] to [place] RN: states she's reviewed pt's referral and had a few questions to clarify before contacting pt to schedule, requesting call back from RN.    RN called back and left VM for Lupita LeashDonna. Informed her that Hoy Finlayllen RN is no longer pt's coordinator and this RN will be pt's new Financial plannernurse coordinator. Left RN's contact information and requested call back to address her questions regarding pt.    Will continue to follow.

## 2015-07-26 NOTE — Telephone Encounter (Signed)
Rec'd call back from [facility]. She reviewed pt's records and wanted to know the timeframe for when pt should be scheduled. Informed her that MD last saw pt in April and would recommend follow up in 3 months, should have appt sometime in July. Jill SideColleen confirms this is doable and will call pt tomorrow to schedule an appt to establish care. Thanked her for her assistance and to call RN w/any other questions in the future.

## 2015-07-26 NOTE — Telephone Encounter (Signed)
[facility], did not leave reason for call but requested call back from RN.    RN called back and left VM apologizing for the phone tag, asked ARNP to leave specific question in next msg in case unable to connect with this RN. Explained that RN will try to call back again tomorrow AM.    Difficulty reaching d/t time difference. Will try to call first thing in the morning.

## 2015-08-17 NOTE — Telephone Encounter (Signed)
Called [facility] to follow up on pt's referral/transition. Spoke to FarwellDonna, clinic scheduler, but was unable to get information re: past/future appt. RN voiced concern because pts are at risk of being lost during such transitions, left RN's contact info and requested call back from ARNP to follow up.

## 2015-09-06 ENCOUNTER — Other Ambulatory Visit (HOSPITAL_COMMUNITY): Payer: Self-pay

## 2015-09-06 LAB — HEPATIC FUNCTION PANEL, EXTERNAL
ALT (GPT), External: 9
AST (GOT), External: 13
Albumin, External: 4.7
Alkaline Phosphatase (Total), External: 85
Bilirubin (Total), External: 0.7
Protein (Total), External: 7.6

## 2015-09-06 LAB — COMPREHENSIVE METABOLIC PANEL, EXTERNAL: GFR, Calc, European American, External: 51

## 2015-09-06 LAB — UA MACROSCOPIC W/REFLEX CULTURE, EXTERNAL
Bilirubin Qual, URN, External: NEGATIVE
Glucose Qual, URN, External: NEGATIVE
Ketones, URN, External: NEGATIVE
Leukocyte Esterase, URN, External: NEGATIVE
Nitrite, URN, External: NEGATIVE
Occult Blood, URN, External: NEGATIVE
Specific Gravity, URN, External: 1.02
Urobilinogen, URN, External: 0.2
pH, URN, External: 6

## 2015-09-06 LAB — CBC, DIFF, EXTERNAL
% Basophils, External: 0
% Eosinophils, External: 1
% Lymphocytes, External: 23
% Monocytes, External: 7
% Neutrophils, External: 69
Basophils, External: 0
Eosinophil Count, External: 0.1
Lymphocyte Count, External: 1.3
Monocytes, External: 0.4
Neutrophils, External: 4

## 2015-09-06 LAB — CBC (HEMOGRAM), EXTERNAL
Hematocrit, External: 34.6
Hemoglobin, External: 11.8
MCH, External: 28.1
MCV, External: 82
Platelet Count, External: 165
WBC, External: 5.7

## 2015-09-06 LAB — PHOSPHATE, EXTERNAL: Phosphate, External: 4.2

## 2015-09-06 LAB — UA MICROSCOPIC W/REFLEX CULTURE, EXTERNAL
Bacteria, URN, External: NONE SEEN
Epith Cells_Squamous, URN, External: NONE SEEN
RBC, URN, External: NONE SEEN

## 2015-09-06 LAB — PROTEIN/CREATININE RATIO, URN, EXTERNAL
Creatinine/Unit, URN, External: 149.1
Protein (Total), URN, External: 20.9
Protein/Creatinine Ratio, External: 140

## 2015-09-06 LAB — CYCLOSPORINE, EXTERNAL: Cyclosporine By LC_MS, External: 90

## 2015-09-06 LAB — BASIC METABOLIC PANEL, EXTERNAL
Calcium, External: 9.6
Chloride, External: 102
Creatinine, External: 1.77
Glucose, External: 104
Potassium, External: 5.1
Sodium, External: 138
Urea Nitrogen, External: 39

## 2015-09-06 LAB — MAGNESIUM, EXTERNAL: Magnesium, External: 1.8

## 2015-10-02 ENCOUNTER — Other Ambulatory Visit (HOSPITAL_BASED_OUTPATIENT_CLINIC_OR_DEPARTMENT_OTHER): Payer: Self-pay | Admitting: Nephrology

## 2015-10-02 DIAGNOSIS — Z940 Kidney transplant status: Secondary | ICD-10-CM

## 2015-10-02 DIAGNOSIS — T8690 Unspecified complication of unspecified transplanted organ and tissue: Secondary | ICD-10-CM

## 2015-10-02 DIAGNOSIS — Z48298 Encounter for aftercare following other organ transplant: Secondary | ICD-10-CM

## 2015-10-02 DIAGNOSIS — Z5181 Encounter for therapeutic drug level monitoring: Secondary | ICD-10-CM

## 2015-11-01 ENCOUNTER — Telehealth (HOSPITAL_BASED_OUTPATIENT_CLINIC_OR_DEPARTMENT_OTHER): Payer: Self-pay | Admitting: Unknown Physician Specialty

## 2015-11-01 DIAGNOSIS — Z940 Kidney transplant status: Secondary | ICD-10-CM

## 2015-11-01 MED ORDER — MYCOPHENOLATE MOFETIL 500 MG OR TABS
1000.0000 mg | ORAL_TABLET | Freq: Two times a day (BID) | ORAL | 1 refills | Status: DC
Start: 2015-11-01 — End: 2015-11-29

## 2015-11-01 MED ORDER — CYCLOSPORINE 25 MG OR CAPS
ORAL_CAPSULE | ORAL | 1 refills | Status: DC
Start: 2015-11-01 — End: 2015-11-29

## 2015-11-01 NOTE — Telephone Encounter (Signed)
Documentation of recent events re: insurance and medications:    RN called pt to follow up. Informed pt that if getting financial assistance is a priority, pt needs to physically go to [facility] and meet with a financial counselor for an in-person interview and explain his current situation (lost job/income). Pt informed them over the phone that his last paystub was $5000/mo, which does not qualify him for any assistance. Instructed pt to bring requested documents and explain this when he meets w/FC.     Provided pt with out of pocket costs to fill 30 day supply of cyclosporine and cellcept at [facility], $125.15 and $71.60. Explained that we cannot waive Rx fees b/c pt was not transplanted here. Pt states he will think about it and get back to RN next week. RN emphasized that it will be most cost-effective for pt to come to [facility] for refills until he gets insurance. Pt verbalized understanding and will follow up w/RN next week.

## 2015-11-01 NOTE — Telephone Encounter (Signed)
Rec'd VM from pt stating he will be able to pay for 30 day supply of immunos and would like prescriptions sent to [facility]. Requests call back from RN.    RN called pt back and confirmed receipt of VM. Informed pt that RN will send e-Rx to [facility] for cyclosporine and cellcept, 30 day supply with one refill, will discuss further refills with MD first.    RN e-Rx'ed the following to [facility]:  1. Cyclosporine 25mg  cap, 100mg  AM and 75mg  PM  2. Mycophenolate Mofetil 500mg , 1000mg  twice daily

## 2015-11-20 ENCOUNTER — Other Ambulatory Visit (HOSPITAL_COMMUNITY): Payer: Self-pay

## 2015-11-20 LAB — CYCLOSPORINE, EXTERNAL: Cyclosporine By LC_MS, External: 90

## 2015-11-20 LAB — BASIC METABOLIC PANEL, EXTERNAL
Calcium, External: 9.3
Carbon Dioxide (Total), External: 16
Chloride, External: 106
Creatinine, External: 2.36
Glucose, External: 104
Potassium, External: 5.3
Sodium, External: 138
Urea Nitrogen, External: 44

## 2015-11-20 LAB — UA MACROSCOPIC W/REFLEX CULTURE, EXTERNAL
Bilirubin Qual, URN, External: NEGATIVE
Glucose Qual, URN, External: NEGATIVE
Ketones, URN, External: NEGATIVE
Leukocyte Esterase, URN, External: NEGATIVE
Nitrite, URN, External: NEGATIVE
Occult Blood, URN, External: NEGATIVE
Protein (Alb Semi Quant), URN, External: NEGATIVE
Specific Gravity, URN, External: 1.015
Urobilinogen, URN, External: 0.2
pH, URN, External: 6

## 2015-11-20 LAB — MAGNESIUM, EXTERNAL: Magnesium, External: 1.9

## 2015-11-20 LAB — CBC, DIFF, EXTERNAL
% Basophils, External: 0
% Eosinophils, External: 2
% Lymphocytes, External: 28
% Monocytes, External: 7
% Neutrophils, External: 63
Basophils, External: 0
Eosinophil Count, External: 0.2
Lymphocyte Count, External: 2
Monocytes, External: 0.5
Neutrophils, External: 4.5

## 2015-11-20 LAB — UA MICROSCOPIC W/REFLEX CULTURE, EXTERNAL
Bacteria, URN, External: NONE SEEN
RBC, URN, External: NONE SEEN

## 2015-11-20 LAB — PHOSPHATE, EXTERNAL: Phosphate, External: 5.3

## 2015-11-20 LAB — HEPATIC FUNCTION PANEL, EXTERNAL
ALT (GPT), External: 9
AST (GOT), External: 17
Albumin, External: 4.7
Alkaline Phosphatase (Total), External: 98
Bilirubin (Total), External: 0.3
Protein (Total), External: 7.4

## 2015-11-20 LAB — CBC (HEMOGRAM), EXTERNAL
Hematocrit, External: 33.7
Hemoglobin, External: 11.6
MCH, External: 27.6
MCV, External: 80
Platelet Count, External: 162
WBC, External: 7.2

## 2015-11-20 LAB — PROTEIN/CREATININE RATIO, URN, EXTERNAL
Creatinine/Unit, URN, External: 124.3
Protein (Total), URN, External: 8.5
Protein/Creatinine Ratio, External: 68

## 2015-11-20 LAB — COMPREHENSIVE METABOLIC PANEL, EXTERNAL: GFR, Calc, European American, External: 36

## 2015-11-29 ENCOUNTER — Telehealth (HOSPITAL_BASED_OUTPATIENT_CLINIC_OR_DEPARTMENT_OTHER): Payer: Self-pay | Admitting: Unknown Physician Specialty

## 2015-11-29 DIAGNOSIS — Z940 Kidney transplant status: Secondary | ICD-10-CM

## 2015-11-29 DIAGNOSIS — I10 Essential (primary) hypertension: Secondary | ICD-10-CM

## 2015-11-29 NOTE — Telephone Encounter (Addendum)
Rec'd VM from pt stating he needs refills, requesting call back at [phone].    RN called pt back to follow up. Pt reports he now as active coverage (only the the state of [place]) and is requesting a 90 day supply of medication refills. 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 Asked pt what his plan was as RN does not think this is the right solution, nor safe patient care. Patient acknowledged this and states he's learned his lesson; he's planning on signing up for a national coverage plan in November during open enrollment. RN reiterated the risks of patient going without seeing a provider and if he plans to be in [place] for more than 1-2 months, that he needs to find a local nephrologist there.     Verified pt's current medications:  1. Cellcept 1000mg  BID  2. Cyclosporin 100mg  AM and 75mg  PM  3. Lisinopril 5mg  daily    Pt requesting a 90 day supply of meds [facility] at [phone]. RN will pend orders to MD to review/sign, no refills. RN explained that his [place] pharmacy may not ship OUT OF STATE and instructed pt to have a back up plan. Pt states he can have the meds delivered to a friend's home in [place] and friend will ship to him in [place]. Pt states he has about 2 weeks worth of medications with him.    Discussed with Dr. Assunta FoundBlosser in person.

## 2015-11-30 MED ORDER — CYCLOSPORINE 25 MG OR CAPS
ORAL_CAPSULE | ORAL | 0 refills | Status: DC
Start: 2015-11-30 — End: 2023-05-07

## 2015-11-30 MED ORDER — LISINOPRIL 5 MG OR TABS
ORAL_TABLET | ORAL | 0 refills | Status: DC
Start: 2015-11-30 — End: 2018-09-23

## 2015-11-30 MED ORDER — MYCOPHENOLATE MOFETIL 500 MG OR TABS
1000.0000 mg | ORAL_TABLET | Freq: Two times a day (BID) | ORAL | 0 refills | Status: AC
Start: 2015-11-30 — End: ?

## 2015-11-30 NOTE — Telephone Encounter (Signed)
Recent lab results received. Increased creat 2.36 from baseline cr 1.4-1.6. Lab results scanned and attached to this encounter. Will notify Dr. Assunta FoundBlosser via Max msg.

## 2015-11-30 NOTE — Telephone Encounter (Signed)
RN called [facility] in [place] to follow up on Rx from this morning. Was informed that Rx for cellcept and lisinopril were approved for 90day fills at retail pharmacy. Cyclosporin has not been run yet but they don't anticipate any problems. Asked to call RN if there are any issues, provided RN call back number.     RN called pt and left VM letting him know of above. Instructed pt to call pharmacy tomorrow to make sure Rx filled before picking up. Left RN number to call back with questions.

## 2015-11-30 NOTE — Telephone Encounter (Signed)
Pt called RN back and left VM requesting call back. Pt states he's able to get a fill from a retail pharmacy also, so would like to discuss with RN where to send Rx.    RN called pt back to discuss. Pt would like to fill at a local [facility] in [place] if he's able to get a 90-day fill. If not, pt requests Rx be sent to the mail-order pharmacy. Pt provided RN with a different number to call for the mail-order pharmacy: [phone]. RN informed pt that RN will call the retail pharmacy first and see if it is possible to do a 90-day fill and if not, will order to mail-order and call pt back to inform. Pt verbalized understanding and is in agreement with plan.     [facility] local [facility]:   [address] [place] [address], [place], [place] [id number]   [phone]    RN called [facility] and spoke to Li-pharmacist. He explained that they won't know whether or not they can fill for 90 days until they actually run the prescription, then they usually just fill for 30 days instead. RN will send e-Rx to retail pharmacy and requested Li call RN back if he's not able to fill for 90 days so that RN can re-[facility] Rx [facility]. Li took down [employer] contact info and will call if any issues. RN thanked him for his assistance.     Will continue to follow.

## 2015-12-01 NOTE — Telephone Encounter (Signed)
Labs reviewed by Dr. Assunta FoundBlosser, no change to MD recommendations for pt to find a local transplant center and nephrologist ASAP and that we are no longer able to provide care for a pt that we are not seeing.      RN called pt and had a long discussion regarding his care. Reviewed all labs with pt per request. Advised that pt establish care with a local transplant center/nephrologist ASAP and advised testing per MD (DSA, US, biopsy). Pt stated he understood but states he will wait until next month when it's open enrollment. RN instructed pt to start searching for providers NOW and call around to see how long it'll take to be seen, and to do this simultaneously with his insurance search.     RN explained that we are unable to care for pt when we aren't seeing him and only seeing snippets of pt's health such as labs. Pt verbalized understanding and thanked Charity fundraiserN for assistance. RN voiced hope that the next time pt calls, pt will be able to update us on his visit with a new provider. Pt chuckled and stated he'll try his best.    Unfortunately, we will no longer be refilling medications for patient. Patient will need to reach out to his local providers (PCP, nephrologist, transplant center), contact his transplant center or go to a local ED for future Rx.

## 2016-01-09 ENCOUNTER — Telehealth (HOSPITAL_BASED_OUTPATIENT_CLINIC_OR_DEPARTMENT_OTHER): Payer: Self-pay | Admitting: Unknown Physician Specialty

## 2016-01-09 NOTE — Telephone Encounter (Signed)
Late entry for 01/08/16:  Rec'd VM from pt stating he now has insurance set up, states he would like to follow up on RN's recs from several months ago re: getting DSAs, US, etc. Pt requesting referral from us, would like to know if Dr. Assunta FoundBlosser has any recommendations for nephrology providers in [place], [place] or a transplant center. Sent Max msg to MD w/request for recommendations.       Entry for 01/09/16:  Per Dr. Assunta FoundBlosser, would either recommend [facility] (closer to pt) or [facility], both very good transplant centers I the area. Requests notification once pt chooses a center.    RN called pt this AM to follow up. Provided him with the above options and websites for both. Pt would like to review both centers tonight and call RN back with where he would like his referral to be sent. Pt also inquired about getting his creatinine rechecked since it was elevated in October. RN informed pt that Dr. Assunta FoundBlosser is unable to order these labs since pt has not followed up with us, explained that this is unsafe and we'd be providing pt with sub-optimal care. Again, reviewed the importance of establishing care with a local provider. 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 Asked pt to search his local area and find a clinic/provider and make an appt to establish care. Told pt that he may have no medical needs at this time, and he may only be in [place] for several months, but it doesn't matter and he needs to take this step. Informed pt that PCP will be able to order basic labs and can connect pt with a local nephrologist if needed.     Summarized above conversation and assigned pt with 2 tasks to work on:  1. Review transplant center websites for [facility], and let RN know where he would like referral to be sent.  2. Look for a local primary care  doctor and make an appt to establish care, get labs.    Pt repeated back plan to RN and promised to follow up.    Notified Dr. Assunta FoundBlosser via Max msg.

## 2016-01-16 ENCOUNTER — Telehealth (HOSPITAL_BASED_OUTPATIENT_CLINIC_OR_DEPARTMENT_OTHER): Payer: Self-pay | Admitting: Unknown Physician Specialty

## 2016-01-16 NOTE — Telephone Encounter (Signed)
Received call from Joni ReiningNicole at [facility] stating they received our referral, actually saw the patient in clinic today, creat 2.07. RN thanked Joni ReiningNicole for getting the patient in so quickly, relieved that pt has finally seen a provider.    Care everywhere pulled and clinic note reviewed. Note available for MD to review in Epic.    Will notify Dr. Assunta FoundBlosser via Max msg.

## 2016-02-26 ENCOUNTER — Other Ambulatory Visit (HOSPITAL_BASED_OUTPATIENT_CLINIC_OR_DEPARTMENT_OTHER): Payer: Self-pay | Admitting: Nephrology

## 2016-02-26 DIAGNOSIS — I10 Essential (primary) hypertension: Secondary | ICD-10-CM

## 2016-02-26 NOTE — Telephone Encounter (Signed)
Refill request rec'd. Per chart review, pt has established with local providers in [place] last month. Rx refused with instructions to contact new providers.

## 2016-04-03 ENCOUNTER — Encounter (HOSPITAL_BASED_OUTPATIENT_CLINIC_OR_DEPARTMENT_OTHER): Payer: Self-pay | Admitting: Nephrology

## 2017-02-17 ENCOUNTER — Other Ambulatory Visit (HOSPITAL_BASED_OUTPATIENT_CLINIC_OR_DEPARTMENT_OTHER): Payer: Self-pay | Admitting: Nephrology

## 2017-02-17 DIAGNOSIS — Z940 Kidney transplant status: Secondary | ICD-10-CM

## 2017-02-17 NOTE — Telephone Encounter (Signed)
Rec'd refill request for MMF. Per Max, outside transplant pt who has now transferred to another center. Rx refused w/instructions to please contact pt's local nephrologist for refills, FAX: [phone].

## 2018-04-28 ENCOUNTER — Other Ambulatory Visit (HOSPITAL_COMMUNITY): Payer: Self-pay

## 2018-04-28 LAB — BASIC METABOLIC PANEL, EXTERNAL
Calcium, External: 9.6
Carbon Dioxide (Total), External: 20
Chloride, External: 101
Creatinine, External: 2.09
Glucose, External: 108
Potassium, External: 4.8
Sodium, External: 139
Urea Nitrogen, External: 31

## 2018-04-28 LAB — BK VIRUS_PCR QUANT, EXTERNAL: BK Virus Quant Result, External: NEGATIVE

## 2018-04-28 LAB — MAGNESIUM, EXTERNAL: Magnesium, External: 1.6

## 2018-04-28 LAB — COMPREHENSIVE METABOLIC PANEL, EXTERNAL: GFR, Calc, European American, External: 41

## 2018-04-28 LAB — CBC, DIFF, EXTERNAL
% Basophils, External: 1
% Eosinophils, External: 2
% Lymphocytes, External: 28
% Monocytes, External: 8
% Neutrophils, External: 60
Basophils, External: 0.1
Eosinophil Count, External: 0.2
Lymphocyte Count, External: 2.2
Monocytes, External: 0.6
Neutrophils, External: 4.8

## 2018-04-28 LAB — CBC (HEMOGRAM), EXTERNAL
Hematocrit, External: 38
Hemoglobin, External: 12.4
MCH, External: 26.3
MCV, External: 81
Platelet Count, External: 224
WBC, External: 8

## 2018-04-28 LAB — HEPATIC FUNCTION PANEL, EXTERNAL: Albumin, External: 4.6

## 2018-04-28 LAB — PARATHYROID HORMONE, EXTERNAL: Parathyroid Hormone, External: 35

## 2018-04-28 LAB — PHOSPHATE, EXTERNAL: Phosphate, External: 4.9

## 2018-04-28 LAB — PROTEIN/CREATININE RATIO, URN, EXTERNAL
Creatinine/Unit, URN, External: 118.5
Protein (Total), URN, External: 12.2
Protein/Creatinine Ratio, External: 103

## 2018-04-28 LAB — CYCLOSPORINE, EXTERNAL: Cyclosporine By LC_MS, External: 137

## 2018-04-28 LAB — URIC ACID, EXTERNAL: Uric Acid, External: 9.7

## 2018-07-09 LAB — BASIC METABOLIC PANEL, EXTERNAL
Calcium, External: 9.4
Carbon Dioxide (Total), External: 19
Chloride, External: 102
Creatinine, External: 2.24
Glucose, External: 115
Potassium, External: 4.7
Sodium, External: 137
Urea Nitrogen, External: 35

## 2018-07-09 LAB — CBC (HEMOGRAM), EXTERNAL
Hematocrit, External: 35.9
Hemoglobin, External: 11.7
MCH, External: 27
MCV, External: 83
Platelet Count, External: 187
WBC, External: 8.5

## 2018-07-09 LAB — CBC, DIFF, EXTERNAL
% Basophils, External: 1
% Eosinophils, External: 2
% Lymphocytes, External: 21
% Monocytes, External: 7
% Neutrophils, External: 68
Basophils, External: 0.1
Eosinophil Count, External: 0.2
Lymphocyte Count, External: 1.8
Monocytes, External: 0.6
Neutrophils, External: 5.8

## 2018-07-09 LAB — COMPREHENSIVE METABOLIC PANEL, EXTERNAL: GFR, Calc, European American, External: 38

## 2018-07-09 LAB — URIC ACID, EXTERNAL: Uric Acid, External: 9.7

## 2018-07-09 LAB — PHOSPHATE, EXTERNAL: Phosphate, External: 5.1

## 2018-07-09 LAB — MAGNESIUM, EXTERNAL: Magnesium, External: 1.6

## 2018-07-09 LAB — CYCLOSPORINE, EXTERNAL

## 2018-07-09 LAB — HEPATIC FUNCTION PANEL, EXTERNAL: Albumin, External: 4.6

## 2018-07-27 ENCOUNTER — Other Ambulatory Visit (HOSPITAL_COMMUNITY): Payer: Self-pay

## 2018-07-27 LAB — BASIC METABOLIC PANEL, EXTERNAL
Calcium, External: 10.1
Carbon Dioxide (Total), External: 24
Chloride, External: 100
Creatinine, External: 1.93
Glucose, External: 109
Potassium, External: 5
Sodium, External: 138
Urea Nitrogen, External: 27

## 2018-07-27 LAB — HEPATIC FUNCTION PANEL, EXTERNAL: Albumin, External: 4.5

## 2018-07-27 LAB — CBC, DIFF, EXTERNAL
% Basophils, External: 1
% Eosinophils, External: 3
% Lymphocytes, External: 24
% Monocytes, External: 7
% Neutrophils, External: 64
Basophils, External: 0.1
Eosinophil Count, External: 0.2
Lymphocyte Count, External: 1.9
Monocytes, External: 0.6
Neutrophils, External: 5

## 2018-07-27 LAB — MAGNESIUM, EXTERNAL: Magnesium, External: 1.8

## 2018-07-27 LAB — COMPREHENSIVE METABOLIC PANEL, EXTERNAL: GFR, Calc, European American, External: 45

## 2018-07-27 LAB — URIC ACID, EXTERNAL: Uric Acid, External: 9.6

## 2018-07-27 LAB — CBC (HEMOGRAM), EXTERNAL
Hematocrit, External: 35.3
Hemoglobin, External: 12
MCH, External: 27.3
MCV, External: 80
Platelet Count, External: 213
WBC, External: 7.7

## 2018-07-27 LAB — PHOSPHATE, EXTERNAL: Phosphate, External: 4.4

## 2018-07-27 LAB — MYCOPHENOLIC ACID, EXTERNAL: Mycophenolic Acid, External: 1.1

## 2018-07-27 LAB — CYCLOSPORINE, EXTERNAL: Cyclosporine By LC_MS, External: 137

## 2018-09-04 ENCOUNTER — Other Ambulatory Visit (HOSPITAL_BASED_OUTPATIENT_CLINIC_OR_DEPARTMENT_OTHER): Payer: Self-pay | Admitting: Nephrology

## 2018-09-04 DIAGNOSIS — Z48298 Encounter for aftercare following other organ transplant: Secondary | ICD-10-CM

## 2018-09-04 DIAGNOSIS — Z940 Kidney transplant status: Secondary | ICD-10-CM

## 2018-09-04 DIAGNOSIS — T8690 Unspecified complication of unspecified transplanted organ and tissue: Secondary | ICD-10-CM

## 2018-09-05 IMAGING — CR DG CHEST 2V
2 series · 2 of 2 positions shown · non-contrast
Comparison: None.

CLINICAL DATA: 31-year-old male with a history of prior renal
transplant. No current complaints

EXAM:
CHEST  2 VIEW

[w chest pa]
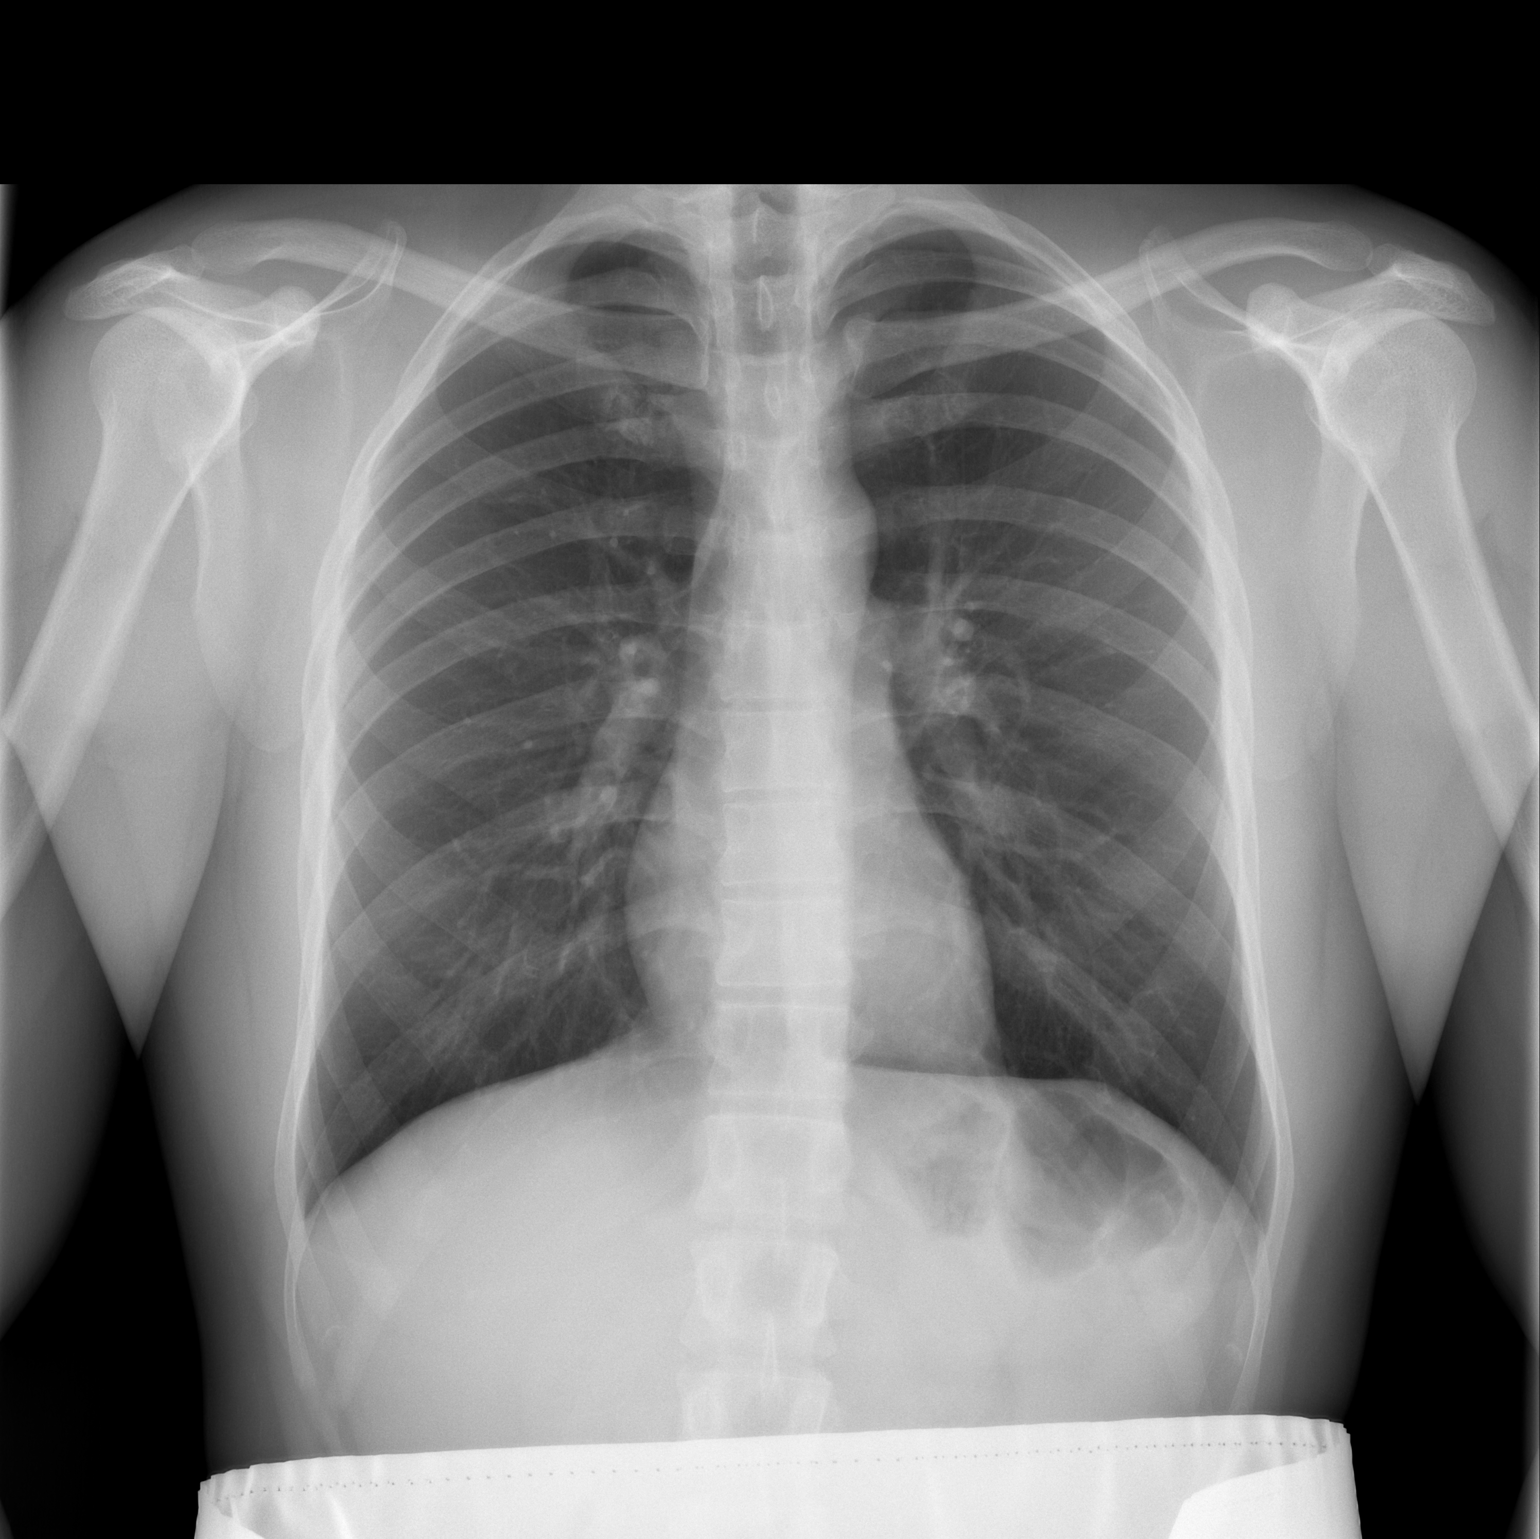

[w chest lat]
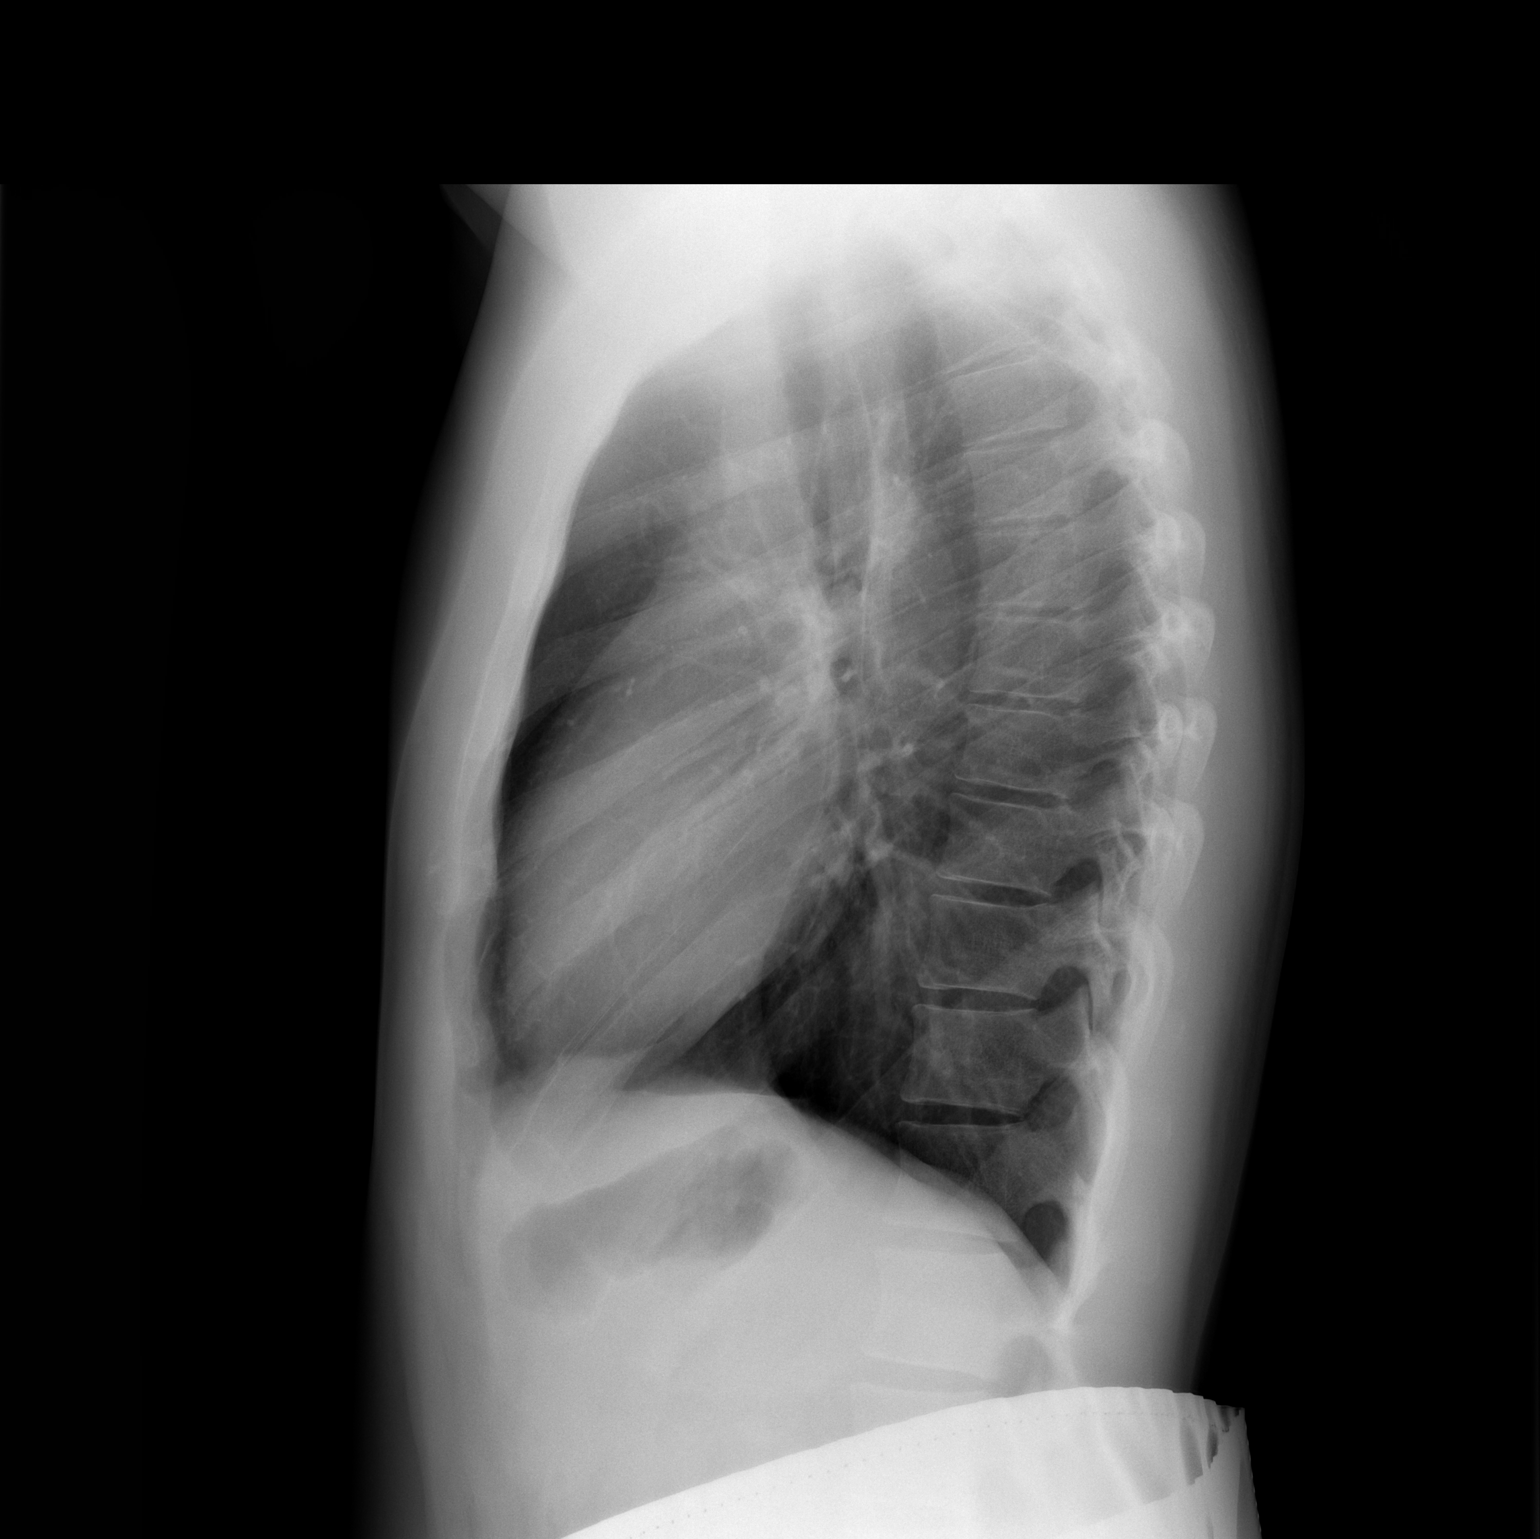

[2 of 2 positions shown; findings below may reference images not displayed]

FINDINGS: The heart size and mediastinal contours are within normal limits.
Both lungs are clear. The visualized skeletal structures are
unremarkable.
IMPRESSION: Negative for acute cardiopulmonary disease

## 2018-09-23 ENCOUNTER — Other Ambulatory Visit (HOSPITAL_COMMUNITY): Payer: Self-pay

## 2018-09-23 ENCOUNTER — Other Ambulatory Visit (HOSPITAL_BASED_OUTPATIENT_CLINIC_OR_DEPARTMENT_OTHER): Payer: Self-pay | Admitting: Nephrology

## 2018-09-23 ENCOUNTER — Ambulatory Visit: Payer: 59 | Attending: Nephrology | Admitting: Nephrology

## 2018-09-23 VITALS — BP 136/86 | HR 73 | Temp 98.1°F | Ht 66.0 in | Wt 159.4 lb

## 2018-09-23 DIAGNOSIS — Z48298 Encounter for aftercare following other organ transplant: Secondary | ICD-10-CM

## 2018-09-23 DIAGNOSIS — T8690 Unspecified complication of unspecified transplanted organ and tissue: Secondary | ICD-10-CM | POA: Insufficient documentation

## 2018-09-23 DIAGNOSIS — I10 Essential (primary) hypertension: Secondary | ICD-10-CM | POA: Insufficient documentation

## 2018-09-23 DIAGNOSIS — D899 Disorder involving the immune mechanism, unspecified: Secondary | ICD-10-CM | POA: Insufficient documentation

## 2018-09-23 DIAGNOSIS — D849 Immunodeficiency, unspecified: Secondary | ICD-10-CM

## 2018-09-23 DIAGNOSIS — Z940 Kidney transplant status: Secondary | ICD-10-CM | POA: Insufficient documentation

## 2018-09-23 LAB — URINALYSIS COMPLETE, URN
Bacteria, URN: NONE SEEN
Bilirubin (Qual), URN: NEGATIVE
Epith Cells_Renal/Trans,URN: NEGATIVE /HPF
Epith Cells_Squamous, URN: NEGATIVE /LPF
Glucose Qual, URN: NEGATIVE mg/dL
Ketones, URN: NEGATIVE mg/dL
Leukocyte Esterase, URN: NEGATIVE
Nitrite, URN: NEGATIVE
Occult Blood, URN: NEGATIVE
Protein (Alb Semiquant), URN: NEGATIVE mg/dL
RBC, URN: NEGATIVE /HPF
Specific Gravity, URN: 1.005 g/mL — ABNORMAL LOW (ref 1.006–1.027)
WBC, URN: NEGATIVE /HPF
pH, URN: 6 (ref 5.0–8.0)

## 2018-09-23 LAB — PROTEIN/CREATININE RATIO, TIMED URINE
Creatinine/24H, Urine: UNDETERMINED mg/(24.h) (ref 1000–2000)
Creatinine/Unit, Urine: 48 mg/dL
Protein (Total), Urine: 5 mg/dL
Protein/Creatinine Ratio: 0.1 (ref <0.2)
Total Protein/24H, Urine: UNDETERMINED g/d (ref 0.05–0.08)

## 2018-09-23 LAB — CBC, DIFF
% Basophils: 1 %
% Eosinophils: 2 %
% Immature Granulocytes: 1 %
% Lymphocytes: 21 %
% Monocytes: 7 %
% Neutrophils: 68 %
% Nucleated RBC: 0 %
Absolute Eosinophil Count: 0.17 10*3/uL (ref 0.00–0.50)
Absolute Lymphocyte Count: 1.61 10*3/uL (ref 1.00–4.80)
Basophils: 0.04 10*3/uL (ref 0.00–0.20)
Hematocrit: 36 % — ABNORMAL LOW (ref 38–50)
Hemoglobin: 12 g/dL — ABNORMAL LOW (ref 13.0–18.0)
Immature Granulocytes: 0.04 10*3/uL (ref 0.00–0.05)
MCH: 26 pg — ABNORMAL LOW (ref 27.3–33.6)
MCHC: 33.1 g/dL (ref 32.2–36.5)
MCV: 79 fL — ABNORMAL LOW (ref 81–98)
Monocytes: 0.53 10*3/uL (ref 0.00–0.80)
Neutrophils: 5.16 10*3/uL (ref 1.80–7.00)
Nucleated RBC: 0 10*3/uL
Platelet Count: 195 10*3/uL (ref 150–400)
RBC: 4.61 10*6/uL (ref 4.40–5.60)
RDW-CV: 12.1 % (ref 11.6–14.4)
WBC: 7.55 10*3/uL (ref 4.3–10.0)

## 2018-09-23 LAB — CYCLOSPORINE BY LCMSMS: Cyclosporine By Lc_Ms: 117 ng/mL

## 2018-09-23 LAB — LIPID PANEL
Cholesterol (LDL): 84 mg/dL (ref <130)
Cholesterol/HDL Ratio: 5.1
HDL Cholesterol: 33 mg/dL — ABNORMAL LOW (ref >39)
Non-HDL Cholesterol: 134 mg/dL (ref 0–159)
Total Cholesterol: 167 mg/dL (ref <200)
Triglyceride: 251 mg/dL — ABNORMAL HIGH (ref <150)

## 2018-09-23 LAB — COMPREHENSIVE METABOLIC PANEL
ALT (GPT): 21 U/L (ref 10–64)
AST (GOT): 18 U/L (ref 9–38)
Albumin: 4.5 g/dL (ref 3.5–5.2)
Alkaline Phosphatase (Total): 97 U/L (ref 35–109)
Anion Gap: 9 (ref 4–12)
Bilirubin (Total): 0.7 mg/dL (ref 0.2–1.3)
Calcium: 9.4 mg/dL (ref 8.9–10.2)
Carbon Dioxide, Total: 26 meq/L (ref 22–32)
Chloride: 101 meq/L (ref 98–108)
Creatinine: 1.92 mg/dL — ABNORMAL HIGH (ref 0.51–1.18)
Glucose: 109 mg/dL (ref 62–125)
Potassium: 4.1 meq/L (ref 3.6–5.2)
Protein (Total): 7.8 g/dL (ref 6.0–8.2)
Sodium: 136 meq/L (ref 135–145)
Urea Nitrogen: 34 mg/dL — ABNORMAL HIGH (ref 8–21)
eGFR by CKD-EPI: 45 mL/min/{1.73_m2} — ABNORMAL LOW (ref >59)

## 2018-09-23 LAB — PARATHYROID HORMONE: Parathyroid Hormone: 92 pg/mL — ABNORMAL HIGH (ref 12–88)

## 2018-09-23 LAB — MAGNESIUM: Magnesium: 1.7 mg/dL — ABNORMAL LOW (ref 1.8–2.4)

## 2018-09-23 LAB — MYCOPHENOLIC ACID: Mycophenolic Acid: 3 ug/mL (ref 1.0–3.5)

## 2018-09-23 LAB — PHOSPHATE: Phosphate: 4.2 mg/dL (ref 2.5–4.5)

## 2018-09-23 NOTE — Progress Notes (Signed)
Dictation #1  DPT:[id number]  IDU:[id number]

## 2018-09-23 NOTE — Progress Notes (Signed)
CHIEF CONCERN:  Herbert Robertson returns for a consult visit after being away from [facility] for the last three years.  He returned from [place], and has been under the care of Dr. Debbra Riding since then.  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  Please see my note from 05/31/2015 for further details.  Since I last saw him on that date, he has not had any episodes of rejection, and underwent a biopsy at [facility], which he reports to be normal.  He continues on cyclosporine 100 mg in the morning and 75 mg at night along, with MMF 1000 mg twice a day.  He was switched off tacrolimus due to concern for hair loss.  He reports blood serum of baseline 1.8 to 2.0 and has not seen any changes recently with this.  Home blood pressures are 120s-130s/80s-90s.  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  He walks every evening for 30 minutes with his family, but does not do intense exercise otherwise.    No recent surgeries.  Please see prior H&P for past history including transplant.    ALLERGIES:  NO KNOWN DRUG ALLERGIES.    MEDICATIONS:  Reviewed and updated in Epic.    Past medical history:  End-stage kidney disease of unclear etiology  Hypertension  Hyperlipidemia    Past surgical history:  Living related kidney transplant at the [facility] on 11/11/2012 from his brother.  ATG induction.  See my note from 05/31/2015 for further details.    SOCIAL HISTORY:  The patient is now living in [place] with his extended family including parents, brother and brother's family, sister and sister's family, his wife and 95-month-old daughter.  No tobacco or  drug use.    FAMILY HISTORY:  Notable for parents both with diabetes.  No family history of chronic kidney disease.    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  No fevers, chills, night sweats, cough, shortness of breath, chest discomfort, abdominal pain, nausea, vomiting, urologic symptoms including dysuria, hematuria, frequency or urgency.  The remainder of a review of systems is negative.    PHYSICAL EXAMINATION:  VITAL SIGNS:  Temperature is 36.7, heart rate 73, blood pressure 136/86, oxygen saturation 100% on room air with a weight of 72.3 kg.  GENERAL:  He is a well-appearing 32 year old in no acute distress.  HEENT:  Reveals anicteric sclerae and moist mucosa with notable gingival hyperplasia, which is nonerythematous and without exudate.  Dentition otherwise appears intact.  NECK:  Supple, without lymphadenopathy, jugular venous distention, or carotid bruits bilaterally.  LUNGS:  Clear to auscultation without rales or rhonchi.  HEART:  Regular rate and rhythm, S1, S2, without murmur or rub.  ABDOMEN:  Soft, nontender, nondistended with normoactive bowel sounds.  Allograft in the right lower quadrant is nontender and without bruit.  EXTREMITIES:  Reveal no clubbing, cyanosis or edema.  NEUROLOGIC:  Grossly intact with normal strength and sensation.  No tremor.  SKIN:  Negative for rash and ulcer.    LABORATORY DATA:  Reviewed with the patient today:  Serum creatinine 1.9.  Urinalysis pending.  Triglycerides,  fasting, 251.  The remainder available in Epic.    IMPRESSION AND PLAN:  Herbert Robertson is a 33 year old man with a history of end-stage kidney disease of unclear etiology, status post living related kidney transplant from his [facility].  Baseline serum creatinine reportedly is 1.8 to 2.0 without proteinuria.  Urinalysis, DSA, and cell-free DNA testing currently pending.    1.  Renal allograft function.  Serum  creatinine reportedly is at baseline, though increased from 2017, and I have concerns that he has volume depletion along with higher than indicated cyclosporine levels.  Awaiting further data from today's labs.  Previous DSA from 10/2013 was negative.  I advised that he increase his liquid intake to 2.5 liters per day, and await lab tests prior to potential conversion from cyclosporine to tacrolimus.    2.  Immunosuppression.  ATG induction was 5.6 mg/kg with a steroid-free regimen.  For now, he will continue on cyclosporine 100 mg in the morning and 75 mg at night with a target goal between 50 and 75.  Level pending.  Should we be able to make the conversion, which I am hopeful for, I would target a tacrolimus trough between 5 and 7.  Continue MMF 1000 mg two times a day.    3.  Hypertension.  The patient's blood pressure remains high normal, and I have encouraged him to adopt a daily intense exercise regimen (aerobic/anaerobic ) of at least 30 minutes per day, avoid and decrease sodium intake, and follow with Dr. Elpidio GaleaAlem.    4.  Hypomagnesemia.  It is low normal, and I am supportive of him continuing a high magnesium diet without supplement.    5.  Iron deficiency anemia.  Hemoglobin is stable at 12.0, and we will check iron studies with ferritin today since he is off iron supplement.    6.  Health maintenance.  He will benefit from an annual influenza vaccination.  He will additionally benefit from followup of his lipids as he make adjustments, recognizing that he is higher risk for diabetes in the context of his medications, diet, and family history.  He will benefit from annual dermatology screening for skin cancer.    We will plan on seeing Herbert Robertson in two years, with labs every 2-3 months per Dr. Elpidio GaleaAlem.  I will be available for any consultation that may be of assistance to his care in the interim.    Jarome Lamashris Blosser, MD  Departments of Medicine & Pediatrics  Division of Nephrology & Transplantation  [facility]  Pager [phone]

## 2018-09-24 DIAGNOSIS — Z48298 Encounter for aftercare following other organ transplant: Secondary | ICD-10-CM | POA: Insufficient documentation

## 2018-09-24 LAB — BK VIRUS PCR QUANT, URINE: BK Virus Urine Quant: NOT DETECTED {copies}/mL

## 2018-09-24 LAB — HLA AB SPEC. MONITOR DSA (SENDOUT)

## 2018-09-25 LAB — VITAMIN D (25 HYDROXY)
Vit D (25_Hydroxy) Interp: HIGH
Vit D (25_Hydroxy) Total: 52.7 ng/mL — ABNORMAL HIGH (ref 20.1–50.0)
Vitamin D2 (25_Hydroxy): <1 ng/mL
Vitamin D3 (25_Hydroxy): 52.7 ng/mL

## 2018-09-28 LAB — ALLOSURE KIDNEY TX RECIPIENT DD CFDNA (SENDOUT): AlloSure dd cfDNA Percent: 0.2 %

## 2019-04-11 NOTE — Progress Notes (Signed)
 [facility]  [facility]  [address]  [place] [address]    CC:   Chief Complaint   Patient presents with   . Foot Pain     dropped object on right foot X2 weeks        Subjective     Herbert Robertson is a 34 y.o. male who presents for C/C of right foot pain going on for more than 1 month.  Patient reports he had dropped a heavy object on his right foot.  The patient is unable to identify the object he dropped.  Patient has tried ice and OTC analgesics for his current symptoms with mild effects.  Patient describes his symptoms worsening, aggravated with weightbearing, intermittent.  Denies similar symptoms in the past.  Denies any other trauma or injury to his right foot/ankle in the past.  Patient does not have a PCP established and request sources and information to establish PCP.  Denies any other associated symptoms.    Pertinent negatives include no fatigue, fever, chest pain, shortness of breath, nausea, vomiting, changes in bowel and bladder function, numbness, tingling, or rash. Denies radiation of pain.     There is no problem list on file for this patient.      .  minoxidiL (LONITEN) 2.5 MG tablet  .  MYCOPHENOLATE  MOFETIL ORAL  .  tacrolimus  (PROGRAF  ORAL)  .  POST OP SHOE    family history is not on file.    The following portions of the patient's history were reviewed and updated as appropriate: allergies, current medications, past family history, past medical history, past social history, past surgical history, and problem list.     Objective      BP 147/88 (BP Location: Right arm, Patient Position: Sitting, BP Cuff Size: Adult) Comment: recheck  Pulse 107   Temp 36.9 C (98.5 F) (Temporal)   Resp 16   Ht 170.2 cm (5' 7)   Wt 70.1 kg (154 lb 9.6 oz)   SpO2 100%   BMI 24.21 kg/m     Pain Score:   8  Pain Loc: Foot  No LMP for male patient.    Physical Exam    Right ankle:   Inspection:  no deformity, or erythema.  Noted mild focal swelling to right lateral ankle  area.  The patient was able to bear weight with reported discomfort to right lateral foot area.   The patient is able to take four steps during the office visit.  Palpation:  There is noted mild pain in the malleolar zone   There is no tenderness over the posterior distal 6 cm of tibia or tip of medial malleolus  There is no tenderness over the posterior 6 cm of the fibula or tip of the lateral malleolus.  There is noted mild pain in the midfoot zone laterally.  There is no tenderness at the base of 5th metatarsal   There is noted mild tenderness at the base of the navicular laterally.  No visible bruising or laxity on pulling heel forward. Palpation of Achilles tendon with no noted gap. Negative Calf squeeze test and Matles test.    Right foot shows tenderness at the insertion of the plantar fascia in the midfoot.    Full range of motion of right ankle and forefoot with reported discomfort to right lateral foot area.  Rotational forces cause mild pain to right lateral foot area.     Gait: Patient is able to  ambulate with noted guarded gait shifting his weight to medial aspect of his right foot.    LABS AND STUDIES    I personally reviewed today's imaging results.    XR Foot Complete Right    Addendum Date: 04/11/2019    ADDENDUM: #1 If there is persistent clinical concern for underlying injury, consider CT or MRI. Created:04/11/2019 12:09 PST;[id number]    Result Date: 04/11/2019  IMPRESSION: No acute osseous findings. Created:04/11/2019 12:04 PST;[id number]      Assessment/PLAN:    1. Acute foot pain, right    - XR Foot Complete Right; Future  - Orthopedics Referral - [facility]    2. Sprain of right foot, initial encounter    - Orthopedics Referral - [facility]    This patient clinically appears well and stable. Patient chart, HPI, and PE reviewed.  Ordered orthopedic referral to evaluate his worsening right foot pain symptoms.  X-ray of right foot noted with no abnormalities.  Treatment recommendations and after visit home  care interventions discussed and provided.    Drink plenty of fluids to prevent dehydration.  Take OTC analgesics as needed for pain and fever management.    Diagnoses, return precautions and treatment plan discussed and patient voices understanding. All questions answered.     Patient encouraged to call with any questions.   Return if feeling worse   F/U with PCP for continuity of care.   To ER with any emergency concerns.    Influenza: Patient counseled on the benefits of flu vaccination.    Note: This dictation was completed with voice recognition software. Although the note was reviewed for errors, there may still be occasional unintentional word substitutions, errors in spelling, word choice, order, or replacement of words may be present.    Gurvinder Dhillon, ARNP      12:20 PM; 04/11/2019        Patient Instructions       BIG: Rest: At first, keep weight off the ankle as much as you can. SABRA  BIG: Ice: Put an ice pack on the ankle for20 minutes. Remove the pack and wait at least 30 minutes. Repeat for up to 3 days. This helps reduce swelling.  BIG: Compression: To reduce swelling and keep the joint stable, you may need to wrap the ankle with an elastic bandage.   BIG: Elevation: To reduce swelling, keep your ankle raised above your heart when you sit or lie down.

## 2019-04-30 ENCOUNTER — Encounter (HOSPITAL_COMMUNITY): Payer: Self-pay

## 2019-05-01 NOTE — Historical Transplant Studies (Signed)
Herbert Robertson, Herbert Robertson  [phone])  Kidney Pre-Transplant Studies  Referral Date: 08/26/2013  Status: Transplanted  ____________________________________________________________________________________    Transplant History: Kidney on 11/11/2012    Primary Diagnosis:       ABO:      Family History:      Education/Employment History:       History of Present Illness:     Medication History:     Alcohol History:     Tobacco History:      Recreational Drug History:

## 2019-05-01 NOTE — Historical Transplant Communications (Signed)
Anastacio, [place]  [phone])  Transplant Communications  ____________________________________________________________________________________    Date: 12/18/2018  Type: Chart in review  Entry By: morantes  Outside [facility] 11/11/2012, >6y.  Seen 8/19, due for RTC 09/2020, in TS.  Last labs 11/3 (to students to input).    Will forward to RN Coordinator-Jen B to transfer care.       ____________________________________________________________________________________    Date: 12/18/2018  Type: Medical info  Entry By: Carloyn Manner  Centralized Medical Records scanning requested for documents:  Outside Records  Dr. Debbra Riding clinic note 12-16-2018  ____________________________________________________________________________________    Date: 10/30/2018  Type: Action  Entry By: Carloyn Manner  Fax received:  Dr. Debbra Riding  has agreed to Dr. Carma Lair plan on conversion to Tac.  ____________________________________________________________________________________    Date: 10/26/2018  Type: Action  Entry By: Carloyn Manner  Fax to Lennox Laity, MD: Hello Dr. Debbra Riding,    Per Dr. Nelly Rout:  The patient specifically brought up his concerns of gingival hyperplasia on the 8/19 visit, which are known to be associated with long-term cyclosporine use, but not Tacrolimus, and that he is no longer concerned about hair loss (which may have been due to higher dose Tacrolimus).   We recommend to convert from CsA 100/75 to Tacrolimus 36m BID and repeat labs in 1 week after the change.    The  negative DSA and normal Allosure were faxed to your office earlier.      We hope this answers your questions.  It is OK for patient to convert to Tacrolimus.  Thank you.   ____________________________________________________________________________________    Date: 10/26/2018  Type: Email message  Entry By: mCarloyn Manner From: BCorwin Levins[[email or web address]   Sent: Monday, October 26, 2018 9:55 AM  To: MLoleta BooksRN  Subject: Re: MEarlie Server [id number]: You have a message in MWaianae   The patient specifically brought up his concerns of gingival hyperplasia, which are known to be associated with long-term cyclosporine use, but not tacrolimus, and that he is no longer concerned about hair loss (which may have been due to higher dose tacrolimus). Please talk with him about this, and find out what his questions or concerns are. Presuming he is ok with the change, I would convert from CsA 100/75 to Tacrolimus 221mBID and repeat labs in 1 week after the change - see below.    Please share the negative DSA and normal Allosure along with the above recommendations for Dr AlDebbra Ridingo execute.    Thanks,  ChGerald Stabs   (Per Dr. BlCarma Lair/19  lab review:  Would change to Tacrolimus 48m448mID, goal 5-7 (see my last note). Stop CSA. Thanks   ____________________________________________________________________________________    Date: 10/26/2018  Type: Action  Entry By: morCarloyn Mannerax to AstLennox LaityD: As requested,  Labs: Allosure results enclosed.   ____________________________________________________________________________________    Date: 10/26/2018  Type: Action  Entry By: morCarloyn Mannerr. BloNelly Rout Pt was seen on 8/19.  I will forward labs to Dr. AleDebbra Riding He is inquiring on changing Tacrolimus for the patient due to gingival hyperplasia'. Requesting call back, [phone]  ____________________________________________________________________________________    Date: 10/23/2018  Type: Phone message  Entry By: mayMarlyce Hugeedical assistant for Dr. AleDebbra [facility]idingom [facility]. Dr. AleDebbra Riding wondering if Dr. BloNelly Rout alright with changing Tacrolimus for the patient due to gingival hyperplasia. Dr. AleDebbra Ridings also wondering what the patient's DSA was.  Requests call back at ph#: [phone].   ____________________________________________________________________________________    Date: 10/13/2018  Type: Medical info  Entry By: [email or web address]   Centralized Medical Records scanning requested for documents:  Outside Records    09/23/2018 Transplant Clinic Note  ____________________________________________________________________________________    Date: 10/01/2018  Type: Medical info  Entry By: [email or web address]  Re: 09/23/2018 outside note to scan of type  - Outside Lab/Pathology: Allosure Test Results.  ____________________________________________________________________________________    Date: 09/30/2018  Type: Action  Entry By: Carloyn Manner  Fax to Lennox Laity, MD: [facility] clinic note & labs.    We made no changes.    We will plan on revisit in 2y August 2022 unless we are needed earlier.     Thank you.  ____________________________________________________________________________________    Date: 09/30/2018  Type: Medical Info  Entry ByCarloyn Manner  Sample date: 09/23/2018 DSA: Negative for IgG antibodies to donor HLA.  ____________________________________________________________________________________    Date: 09/28/2018  Type: Medical Info  Entry ByCarloyn Manner  Sample date: 09/23/2018  AlloSure: 0.20  ____________________________________________________________________________________    Date: 09/23/2018  Type: Action  Entry By: morantes  VM from pt.  Would like Dr. Nelly Rout to know his Sodium Bicarb dose is 656m tabs, take 3 tabs bid.     Forwarding to Dr. BNelly Rout  Pt now has my contact info.   ____________________________________________________________________________________    Date: 09/15/2018  Type: Medical info  Entry By: [email or web address] Centralized Medical Records scanning requested for documents:  Progress Note    FU by EMadelon Lips MD, on 12/31/2017  ____________________________________________________________________________________    Date: 09/04/2018  Type: Action  Entry By: [email or web address]  Spoke with pt ans scheduled him for appt with Dr BNelly Routon 8/19 at [facility] for LTFU, CSA, UA pr/cr, BK unr, DSA  protocol, allosure, MPA.  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  He is followed by Dr. ADebbra Riding    We will move forward with scheduling.  Last seen May 31, 2015 w/Dr. BNelly Rout  This will be New , 60 min, in clinic w/APP or Dr BNelly Rout Lab/Task/Due list completed.   To Melissa to open or reopen referral.       ____________________________________________________________________________________    Date: 08/26/2018  Type: Medical info  Entry By: mCarloyn Manner Centralized Medical Records scanning requested for documents:  Outside Records  Dr. ADebbra Riding06-30-2020  ____________________________________________________________________________________    Date: 08/26/2018  Type: Action  Entry By: mCarloyn Manner Fax to ALennox Laity MD: Good Morning,     [place]Dr. [place]bra [place]iding    We has on our reminder list to have pt to re-establish w/[facility] in summer.    If so, then can we have most recent note & last full 2 sets of labs for review?    Thank you.   ____________________________________________________________________________________    Date: 08/26/2018  Type: Chart in review  Entry By: morantes  Need to confirm pt has established w/Dr. ADebbra Riding  This office can decide if  pt is ready to re-establish with [place]   ____________________________________________________________________________________    Date: 03/31/2018  Type: Action  Entry By: mWynelle Bourgeois Spoke to pt and informed him that Dr. BNelly Routthat his CR is stable and would like him to see Dr. ADebbra Ridingfirst. The pt informed me that he got his appt earlier with  Dr. Debbra Riding. He will be seeing her on the 27th. I told him after that Dr. Nelly Rout will assess his visit with her and then see if we need to get him scheduled for spring/summer. Pt understood plan.    ____________________________________________________________________________________    Date: 03/30/2018  Type: Email message  Entry By: cecelee  Dr. Nelly Rout, is it ok to wait to hear from Dr. Frederik Pear office after pt's appt w/her in March? Or do you want to see pt at [facility] sooner than that? He was seen by [facility] in December and no issues noted on 12/10 appt note.  ____________________________________________________________________________________    Date: 03/30/2018  Type: Action  Entry By: cecelee  Chart reviewed. Last Scr 1.9 on 12/12, 2.31 on 12/10. Appears pt is still in contact w/[facility] and they [facility]. Pt has an appt w/local neph in March.   ____________________________________________________________________________________    Date: 03/27/2018  Type: Action  Entry By: Wynelle Bourgeois  Pt called again, and left a message that he can't find a local nephrologist till next month. He is requesting to see Dr. Nelly Rout again. He stated that his creatinine is elevated. Please advise.   ____________________________________________________________________________________    Date: 02/11/2018  Type: Action  Entry By: cecelee  Following up on VM from 12/13 from [place]. Sent the following msg to [place]:  Urban Gibson,  Sonu spoke to pt already, per Dr. Nelly Rout, pt will need to establish with a general nephrologist. Can you please call [facility] and inform them of this and to check in w/pt regarding where the referral needs to go?  Thank you!  ____________________________________________________________________________________    Date: 02/10/2018  Type: Action  Entry By: Candy Sledge to pt and informed him that Dr. Nelly Rout can not take on new primary patients. I asked him to establish care with a local nephrologist and then we will make his care with the local nephrologist.   ____________________________________________________________________________________    Date:  01/29/2018  Type: Action  Entry By: Wynelle Bourgeois  Pt left another vm that he is moving back to [facility] and would like to see Dr. Nelly Rout again.   ____________________________________________________________________________________    Date: 01/16/2018  Type: Phone message  Entry By: Ludger Nutting to Gerald Stabs, Received Friday 12/13 at 9:47 AM: Catalina Antigua calls from [facility]. The patient is moving back to [facility] and would like to return to Dr. Nelly Rout and set up an appointment. Catalina Antigua would like a fax number and contact info.     [phone]  ____________________________________________________________________________________    Date: 02/17/2017  Type: Action  Entry By: cecelee  Rec'd refill request for [facility]. Per Max, outside transplant pt who has now transferred to another center. Rx refused w/instructions to please contact pt's local nephrologist for refills, FAX: [phone].  ____________________________________________________________________________________    Date: 10/23/2016  Type: Medical info  Entry By: Janett Billow  Re: 09/06/2015 outside note to scan of type  - Outside Lab/Pathology: lab results from [place].  ____________________________________________________________________________________    Date: 10/22/2016  Type: Medical info  Entry By: Janett Billow  Re: 11/20/2015 outside note to scan of type  - Outside Lab/Pathology: bloodwork results from [place].  ____________________________________________________________________________________    Date: 05/14/2016  Type: Action  Entry By: cecelee  Updated demographics with new Transplant Center provider, Dr. Nolon Lennert at [facility]. Dr. Nelly Rout removed as primary neph.  ____________________________________________________________________________________    Date: 02/26/2016  Type: Prescription refill  Entry By: cecelee  Refill request  rec'd. Per chart review, pt has established with local providers in [place] last month. Rx refused  with instructions to contact new providers.  ____________________________________________________________________________________    Date: 02/15/2016  Type: Action  Entry By: cecelee  Rec'd fax from [facility] requesting pt records from the past year along with a letter stating outside providers need to be sending records to the transplant center. RN printed pt's one clinic note from 2017 in April and faxed back stating pt was not in the state of [place] in 2017 and was seen at [facility] only once.  ____________________________________________________________________________________    Date: 01/16/2016  Type: Action  Entry By: cecelee  Received call from Elmyra Ricks at [facility] stating they received our referral, actually saw the patient in clinic today, creat 2.07. RN thanked Elmyra Ricks for getting the patient in so quickly, relieved that pt has finally seen a provider.    Care everywhere pulled for MD to review clinic note. Forwarding to Dr. Nelly Rout as Juluis Rainier.  ____________________________________________________________________________________    Date: 01/09/2016  Type: Action  Entry By: cecelee  Per Dr. Nelly Rout, would either recommend [facility] (closer to pt) or [facility], both very good transplant centers I the area. Requests notification once pt chooses a center.    RN called pt this AM to follow up. Provided him with the above options and websites for both. Pt would like to review both centers tonight and call RN back with where he would like his referral to be sent. Pt also inquired about getting his creatinine rechecked since it was elevated in October. RN informed pt that Dr. Nelly Rout is unable to order these labs since pt has not followed up with [place], explained that this is unsafe and we'd be providing pt with sub-optimal care. Again, reviewed the importance of establishing care with a local provider. 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 Asked pt to search his local area and find a clinic/provider and make an appt to establish care. Told pt that he may have no medical needs at this time, and he my only be in [place] for several months, but it doesn't matter and he needs to take this step. Informed pt that PCP will be able to order basic labs and can connect pt with a local nephrologist also.     Summarized above conversation and assigned pt with 2 tasks to work on:  1. Review transplant center websites for [facility] and [facility] and let RN know where he would like referral to be sent.  2. Look for a local primary care doctor and make an appt to establish care, get labs.    Pt repeated back plan to RN and promised to follow up.  ____________________________________________________________________________________    Date: 01/08/2016  Type: Phone message  Entry By: cecelee  Rec'd VM from pt stating he now has insurance set up, states he would like to follow up on RN's recs from several months ago re: getting DSAs, [place], etc. Pt requesting referral from [place], would like to know if Dr. Nelly Rout has any recommendations for nephrology providers in [place], [place] or a transplant center. Sending to MD to advise.  ____________________________________________________________________________________    Date: 12/01/2015  Type: Action  Entry By: cecelee  Refer to Epic telephone encounter dated 11/29/15 for details.  Unfortunately, we will no longer be refilling medications for patient. Patient will need to reach out to his local providers (PCP, nephrologist, transplant center), contact his transplant center or go to a local ED for future Rx.  ____________________________________________________________________________________    Date: 12/01/2015  Type: Medical info  Entry By: cecelee  From Immuno review: RN called pt had a long discussion  regarding his care. Reviewed all labs with pt per request. Advised that pt establish care with a local transplant center/nephrologist ASAP and advised testing per MD (DSA, [place], biopsy). Pt stated he understood but states he will wait until next month when it's open enrollment. RN instructed pt to start searching for providers NOW and calling to see how long it'll take to be seen and do this simultaneously with his insurance search. Explained that we are unable to care for pt when we aren't seeing him and only seeing snippets of labs. Pt verbalized understanding and thanked Therapist, sports for assistance. RN voiced hope that pt will be able to update [place] on his visit with a new provider.   ____________________________________________________________________________________    Date: 11/30/2015  Type: Action  Entry By: cecelee  RN called pt's local [facility] in [place] and confirmed they were able to fill 90 day supply of Rx. Called pt and left VM informing him also.  ____________________________________________________________________________________    Date: 11/30/2015  Type: Medical info  Entry By: cecelee  Obtained recent labs from 10/16. Increased creat 2.36 from baseline cr 1.4-1.6. Lab results scanned and attached to Epic encounter 10/25 and also forwarded to students to enter in [place]. Forwarding to Dr. Nelly Rout.  ____________________________________________________________________________________    Date: 11/29/2015  Type: Action  Entry By: cecelee  RN called pt back to discuss. See Epic encounter for details.  ____________________________________________________________________________________    Date: 11/29/2015  Type: Phone message  Entry By: cecelee  Rec'd VM from pt stating he needs refills, requesting call back at [phone].    ____________________________________________________________________________________    Date: 11/14/2015  Type: Action  Entry By: Wynelle Bourgeois  Called pt to set up an appt with Dr.  Nelly Rout. He said he doesn't have insurance that will cover him in [place] and therefore he doesn't feel like he can apply for charity care either. He got his meds for a discounted price and he may be moving to [place] for a new job.   ____________________________________________________________________________________    Date: 11/14/2015  Type: Action  Entry By: Wynelle Bourgeois  LVM for pt to schedule appt with Dr. Nelly Rout.   ____________________________________________________________________________________    Date: 11/06/2015  Type: Action  Entry By: cecelee  Late entry for phone call w/pt on 9/28:  Pt called to confirm orders for meds were sent to [facility]. RN confirmed this and instructed pt to stop by to see a Development worker, community. Pt declined and stated he did not want to address his insurance needs at this time. RN explained pt will need to get labs and see MD to continue getting refills. Pt stated he would call RN if he had any needs in the interim.     RN only ordered 30 day supply of immunos x1 refill. Routing to Dr. Nelly Rout to review/advise. Will need plan, not sure if ok to continue prescribing Rx w/o follow up.  ____________________________________________________________________________________    Date: 11/01/2015  Type: Action  Entry By: cecelee  RN called pt back and confirmed receipt of VM. Informed pt that RN will send e-Rx to [facility] for cyclosporine and cellcept, 30  day supply. Will discuss refills with Dr. Nelly Rout.   ____________________________________________________________________________________    Date: 11/01/2015  Type: Phone message  Entry By: cecelee  VM from pt to RN stating he will be able to pay for 30 day supply of immunos and would like prescriptions sent to [facility]. Requests call back from RN.  ____________________________________________________________________________________    Date: 10/27/2015  Type: Meds/Ins/Fin Assistance  Entry By: cecelee  RN called pt to follow up. Informed pt that if  getting financial assistance is a priority, pt needs to physically go to [facility] and meet with a financial counselor for an in-person interview and explain his current situation (lost job/income). Pt informed them over the phone that his last paystub was $5000/mo, which does not qualify him for any assistance. Instructed pt to bring requested documents and explain this when he meets w/FC.     Provided pt with out of pocket costs to fill 30 day supply of cyclosporine and cellcept  at [facility], $125.15 and $71.60. Explained that we cannot waive Rx fees b/c pt was not transplanted here. Pt states he will think about it and get back to RN next week. RN emphasized that it will be most cost-effective for pt to come to [facility] for refills until he gets insurance. Pt verbalized understanding and will follow up w/RN next week.   ____________________________________________________________________________________    Date: 10/26/2015  Type: Action  Entry By: cecelee  Sent email with info below to pharmacy billing and to [place] financial assistance:     Paula/Steve: are you guys familiar with this prescription waiver? I know Merry Proud MSW has paid for immuno Rx in the past but that funding is only for [place]-transplanted pts and this pt did not get transplanted here. Pt is currently on cellcept 1045m BID and cyclosporine 1028m75mg. What would be the estimated out of pocket cost?    [place]/ clear: what options does this pt have in regards to charity care? Pt has no insurance and recently lost his job.    Thank you for your assistance!    ____________________________________________________________________________________    Date: 10/26/2015  Type: Action  Entry By: cecelee  Late entry for 10/23/2015 and another follow up call on 10/26/2015:    Pt called to inform RN he has left several msgs but has not heard back from [facility]. [facility] Dr. BlNelly Routan send the pharmacy a prescription  waiver and pt will be able to pick up his medications for free. Pt did not know who he spoke to and had minimal information. RN will reach out to [place]/pharmacy billing to follow up on this information.     RN also informed pt that he will likely need to come in for an appt to see MD, get Rx ordered, and [facility]. Pt has no insurance and cannot pay out of pocket for MD visit. [facility].  ____________________________________________________________________________________    Date: 10/25/2015  Type: Phone message  Entry By: jyRudene AndaVoicemail to ChGerald Stabsrom Pt:   Received Wednesday, 9/20 at 12:50pm.     Pt requests a call back from RN. Call back number: [phone] ____________________________________________________________________________________    Date: 10/19/2015  Type: Meds/Ins/Fin Assistance  Entry By: cecelee  RN called pt to follow up. [place] no plans as to what he will do  next. Pt's insurance in [place] is still pending and will be [place] if [place]. Pt plans on having Rx filled in [place] and sent to his [place] address and have friend mail to [place] (if approved). RN verified that pt has enough medications through the month of September.     Pt was last seen by Dr. Nelly Rout in April. Pt will need follow up appt and labs in the next few weeks. He will likely need at minimum a month supply of meds to cover him while his insurance situation gets resolved.     RN instructed pt to contact his insurance company next week to follow up on status and to call RN if nothing resolved by the end of the week. RN instructed pt to call financial assistance to discuss situation and fill out charity care form. Pt verbalized understanding and is in agreement w/plan.    Routing to MD as Juluis Rainier. Sending to [place] [facility] to follow up. Please call pt and assist. He can be reached at [phone] . Thank you!   ____________________________________________________________________________________    Date: 10/16/2015  Type: Action  Entry By: Hazle Nordmann w/ pt. Pt still in midst of situating insurance issue. He applied on 8/15 and since has been cooperating whenever the insurance requests new information. The last time he sent over information was 9/8. He called them again today and they informed him to wait 3-4 days for an approval/refusal. Pt also lost his job on 8/21 because his contract ended. He moved from [place] to [place] and plans on staying for 2 weeks+. He needed a refill on his CSA (100/75) and MMF (1000/1000) on 9/1 but wasn't able to due to lack of insurance. I suggested he call out financial office and SW to receive assistance and provided him with both numbers.  ____________________________________________________________________________________    Date: 09/20/2015  Type: Action  Entry By: cecelee  RN called pt back to follow up on his VM. Reviewed lab results and answered pt questions. Pt states he needs to submit a few documents to finalize his new insurance plan (which he plans on doing this week), and then will contact [facility] to schedule follow up. He confirms receiving a call from them already and has their contact info to call and schedule. Instructed to call RN w/any questions/concerns until he establishes with his new center and asked pt to be proactive in reaching out to RN if he foresees any medication issues. Pt states he just received a refill and is good for now, verbalized understanding and is in agreement w/plan. To MD as Juluis Rainier.  ____________________________________________________________________________________    Date: 09/20/2015  Type: Phone message  Entry By: cecelee  Rec'd forwarded VM from pt: Pt states he had labs done recently and would like to review results, requesting call back at [phone].   ____________________________________________________________________________________    Date: 08/21/2015  Type: Action  Entry By: cecelee  Rec'd call back from [facility] at [facility]. Informed RN that she just called and spoke to pt to schedule an appt, pt's insurance expired and is in process of getting new plan. Pt requested that she send him a list of all providers along with their education info. Plan is for pt to contact [facility] by 7/24 with the provider he'd like to see, and she will make sure he gets scheduled for an appt in early August. Thanked her for her assistance and requested a copy of first appt note once pt is seen.  ____________________________________________________________________________________    Date: 08/17/2015  Type: Action  Entry By: [place] to follow up on referral/see if pt has been seen or is scheduled to be seen in the near future. Spoke to Trent, clinic scheduler, who was unable to provide any information on pt. RN voiced concern and importance of good transitions, requested call back from [facility], who might have more information. Butch Penny verbalized understanding and took RN's contact info to relay msg to [facility]. Will continue to follow.  ____________________________________________________________________________________    Date: 07/26/2015  Type: Medical info  Entry By: cecelee  Discussed with Dr. Nelly Rout, once pt establishes care at [facility], ok to update referral as transferred to another center. Will follow.  ____________________________________________________________________________________    Date: 07/26/2015  Type: Action  Entry By: cecelee  Rec'd call back from [facility]. She reviewed pt's records and wanted to know the timeframe for when pt should be scheduled. Informed her that MD last saw pt in April and would recommend follow up in 3 months, should have appt sometime in July. Jaclyn Shaggy confirms this  is doable and will call pt tomorrow to schedule an appt to establish care. Thanked her for her assistance and to call RN w/any other questions in the future. To MD as Juluis Rainier.  ____________________________________________________________________________________    Date: 07/25/2015  Type: Action  Entry By: cecelee  RN called back and left VM for Butch Penny. Informed her that Gwenith Spitz is no longer pt's coordinator and this RN will be pt's new Chief Executive Officer. Left RN's contact information and requested call back to address her questions regarding pt.  ____________________________________________________________________________________    Date: 07/25/2015  Type: Phone message  Entry By: Redge Gainer  Received VM from Butch Penny at [facility] to [place] RN: states she's reviewed pt's referral and had a few questions to clarify before contacting pt to schedule, requesting call back from RN.  ____________________________________________________________________________________    Date: 07/10/2015  Type: Action  Entry By: cdwl  Spoke to Maryagnes Amos at [facility], got their fax number and sent the referral. She said that it will take about a week to review the referral and that they will call the pt to schedule after that. Will update MAX with the [facility] contact info.  ____________________________________________________________________________________    Date: 06/28/2015  Type: Prescription refill  Entry By: cecelee  Request for immuno refills. Pended in Epic for MD review.  ____________________________________________________________________________________    Date: 06/07/2015  Type: Medical info  Entry By: cdwl  RTC PRN. Interim labs q 3 months until pt transfers care.   ____________________________________________________________________________________    Date: 05/31/2015  Type: Medical info  Entry By: Vella Redhead  Per Dr. Nelly Rout: please send referral to [facility] in [place].  I  will ask Moshe Salisbury to send this referral.  ____________________________________________________________________________________    Date: 05/02/2015  Type: Action  Entry By: ellenrn  Phone call with patient, reviewed labs and let him know I would call if Dr. Nelly Rout wants to make any changes. He has a cold: nasal discharge, no fever or cough, no SOB.  Urine output and oral intake is normal. He is taking Tylenol and Robitussin. I instructed him not to take Sudafed or similar products or any herbal supplements. He was instructed to seek urgent care if symptoms worsen. He stated understanding and had no further question.  ____________________________________________________________________________________    Date: 04/25/2015  Type: Action  Entry By: Candy Sledge  to pt and scheduled appt with Dr. Nelly Rout on 05/31/15 at 10:30am. Pt will do labs morning of. Labs in epic. Pt did not need appt reminder.   ____________________________________________________________________________________    Date: 04/18/2015  Type: Action  Entry By: Vella Redhead  Signed refill request for Gengraf and Mycophenolate faxed to [facility] (medication previously reconciled with patient during phone call).  ____________________________________________________________________________________    Date: 04/18/2015  Type: Medical info  Entry By: Vella Redhead  Per Dr. Nelly Rout, patient must come to clinic in April, get labs and return to clinic as directed in order to stay with our program. If he is not able to do this, Dr. Nelly Rout will refer him to [facility] in [place].  This information was shared with patient during another phone call; he stated full understanding agrees to get labs done in the next week, weather permitting, and had no further questions.  ____________________________________________________________________________________    Date: 04/18/2015  Type: Medical info  Entry By: Vella Redhead  I received a refill request for patient's immunosuppressants and gave  patient a call:   He is living in [place]; his temporary address is [address], [place], [place].  His [place] address is his permanent address. Patient is working on 3 month contracts with his company, so does not know when or if he will live in [place] again. However, he is insistent on keeping Dr. Nelly Rout as his primary nephrologist. He has not had labs done since November 2016, but has paper orders which include faxing information. I instructed him to get labs done and to make an appointment with Dr. Nelly Rout in April.  He stated understanding and agreement with this plan and had no further questions.  ____________________________________________________________________________________    Date: 02/22/2015  Type: Chart in review  Entry By: ellenrn  Per Test Schecule: Due for appointment (41moprimary) this month.   Sonu informed.  ____________________________________________________________________________________    Date: 02/22/2015  Type: Chart in review  Entry By: ellenrn  [facility]. I will ask CMoshe Salisburyto retrieve and fax these.  ____________________________________________________________________________________    Date: 02/01/2015  Type: Medical info  Entry By: jHaydee Monica Pt due for appt. In TS.   ____________________________________________________________________________________    Date: 12/09/2014  Type: Medical info  Entry By: jHaydee Monica Pt called to request a new mmf RX 90 days worth. [place] the first week of January. I told him that [place] I advised [facility] to [facility] [place] if there are any travel precautions for him. He understands and agrees with the plans.   ____________________________________________________________________________________    Date: 10/29/2014  Type: Medical  info  Entry By: jHaydee Monica From Immuno review: [place] due for an [place] Have asked Sonu to schedule him for then.   ____________________________________________________________________________________    Date: 10/19/2014  Type: Action  Entry By: ellenrn  Phone call with patient, explained testing schedule. He stated understanding and will get these done locally.   [place], [facility] [place] [facility] [place] [facility]  He said he would let JDelsa Saleknow his plans.  ____________________________________________________________________________________    Date: 10/19/2014  Type: Phone message  Entry By: ellenrn  Left message requesting call back to discuss lab draws before his trip at the end of the year. When he calls back, will ask him to have standard labs drawn (standing order in place locally) early October and November with a urine pr/cr in November.  ____________________________________________________________________________________    Date: 10/17/2014  Type: Medical info  Entry By: Haydee Monica  Pt called to review his labs and request iron and mag refills. He says he will be going to [place] in November and then back to [place] in January.   ____________________________________________________________________________________    Date: 10/07/2014  Type: Action  Entry By: cdwl  Fax to local lab: [facility] [place] [address] [place] [address] Phone: [phone], Fax: [phone] . Fax the last 3 full sets of lab results.   ____________________________________________________________________________________    Date: 10/06/2014  Type: Phone message  Entry By: cdwl  Voicemail to Delsa Sale from Pt:  Received Wednesday, 8/31 at 3:02PM.    Called inquiring about recent lab results since he hasn't heard anything about them yet. Please call back at [phone].  ____________________________________________________________________________________    Date: 07/22/2014  Type: Medical info  Entry By: Haydee Monica  Pt called to see if Dr Nelly Rout had reviewed his 5/26 labs yet. In max.   ____________________________________________________________________________________    Date: 06/22/2014  Type: Action  Entry By: cdwl  Mailed 2016 standing lab orders with standard CSA and UA pr/cr to patient's current temporary address:     [facility]  [address] [place] [address]    The lab that he will be going to is updated in his demographics under the second lab info. I will email the patient and let him know that I just mailed these lab orders to his new address. CC Jen.   ____________________________________________________________________________________    Date: 06/07/2014  Type: Medical info  Entry By: cdwl  Re: 11/10/2012 outside note to scan of type  - Outside Records/General: [facility] of Kidney Transplant Study signed by Patient .  ____________________________________________________________________________________    Date: 05/27/2014  Type: Action  Entry By: Haydee Monica  Fax to local lab: [facility] [address] [place] phone: [phone], fax: [phone]. Perform the attached labs at your facility and fax results. 2016 STANDING ORDERS MONTHLY standard CSA, UA pr/cr  ____________________________________________________________________________________    Date: 05/27/2014  Type: Action  Entry By: Haydee Monica  Fax to local lab: [facility] [address] [place] phone: [phone], fax: [phone]. Perform the attached labs at your facility and fax results. 2016 STANDING ORDERS standard CSA, UA pr/cr  ____________________________________________________________________________________    Date: 05/23/2014  Type: Action  Entry By: Haydee Monica  Fax to local lab: [facility] [address] [place] phone: [phone], fax: [phone]. Perform the  attached labs at your facility and fax results.   ____________________________________________________________________________________    Date: 05/23/2014  Type: Medical info  Entry By: Norval Gable to patient and he understands instructions to change his CSA dose back to 41m BID. He will get labs later this week. [place] He asks if Dr BNelly Routcould personally review his labs. He was concerned about his K and Phos which were high. He says he feels well although stressed about this job. Will forward to Dr BNelly Rout   ____________________________________________________________________________________    Date: 04/11/2014  Type: Medical info  Entry By: jHaydee Monica From Immuno  review: RTC 6 mos, Sept 2016. Q 2 mos labs.  ____________________________________________________________________________________    Date: 01/05/2014  Type: Medical info  Entry By: Haydee Monica  From Immuno review: RTC 3 mos, March 2016. Labs in 2 weeks, then monthly.   ____________________________________________________________________________________    Date: 12/17/2013  Type: Medical info  Entry By: cdwl  Re: 11/18/2013 outside note to scan of type  - Outside Records/General: Signed HLA Typing Report .  ____________________________________________________________________________________    Date: 12/10/2013  Type: Medical info  Entry By: Janett Billow  Re: 11/10/2013 outside note to scan of type  - Outside Records/General: HLA results from past 5 years.  ____________________________________________________________________________________    Date: 12/07/2013  Type: Action  Entry By: Santiago Glad called Mukarram and he reports he just got labs recently at [facility] in [place].  I have sent a fax to request these labs.  He reports that he overall feels well other then the cold he currently has.  He has no solid plans to move at this time but continues to look for jobs in other cities so these plans could change.   ____________________________________________________________________________________    Date: 12/07/2013  Type: Action  Entry By: ndouglas  Fax to local lab: [facility] phone: [phone], fax: [phone]. Fax the last 3 full sets of lab results.   ____________________________________________________________________________________    Date: 12/07/2013  Type: Action  Entry By: Kristopher Glee  Please contact this patient, request repeat labs, which he missed in October and assess his health and work/moving plans. I will determine need/timing of biopsy after. Thanks  ____________________________________________________________________________________    Date: 12/07/2013  Type: Chart in review  Entry By: chrisbl  HLA data from [facility] reviewed, test date for patient 07/08/2012, for donor (brother) 08/04/2012. 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 Overall, a better than 1 haplotype match per available data. Primary data to be scanned into patient's chart.  ____________________________________________________________________________________    Date: 12/06/2013  Type: Medical info  Entry By: Haydee Monica  Pt called to see what he could take for his cold symptoms. He has a runny/stuffy nose. He is feeling well enough to still go to the gym. Has no other symptoms at this time. He understands that day/nyquil are okay and he will ask the pharmacist for help finding a robitussin that will not raise his BP. He understands to call me if he feels worse.   ____________________________________________________________________________________    Date: 12/03/2013  Type: Action  Entry By: Roseanna Rainbow calls with request for information from his "Kidney match from his brother from [place]".  Under media tab in Epic there is the request for the HLA and DSA recordsfaxed from [place] but the results are not there. They are probably still waiting to be scanned in  Medical Records.  Plan: Check again next week to see if they have been scanned.  I called Mukarram and let him know this.  ____________________________________________________________________________________    Date: 11/18/2013  Type: Phone message  Entry By: Roseanna Rainbow calls today to see if we have recieved his PRE- transplant HLA report which was requested 9/30 by Delsa Sale from the [facility]. We have not recieved these records yet.  I let Mukarram know this.  He had the Tx Coordinators name and # from [place] ph# [phone],  I called Dan and left a VM for him to call us bac,  ____________________________________________________________________________________    Date: 11/04/2013  Type:  Medical Info  Entry By: Haydee Monica  DSA sample date: 11/03/2013 Negative for IgG antibodies to donor HLA.   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  ____________________________________________________________________________________    Date: 11/03/2013  Type: Medical info  Entry By: Haydee Monica  Records request for PRE transplant HLA records for donor/recipient from the [facility]. In TS.   ____________________________________________________________________________________    Date: 10/27/2013  Type: Medical info  Entry By: Haydee Monica  Pt called to review his labs from clinic. He asked if Dr Wynetta Emery was still planning on getting an [place] and CXR. I don't see that scheduled, so will check with Dr Wynetta Emery.   ____________________________________________________________________________________     Date: 09/28/2013  Type: Medical info  Entry By: Haydee Monica  Records request for HLA/DSA records from the [facility]. In TS.   ____________________________________________________________________________________    Date: 09/28/2013  Type: Action  Entry By: Candy Sledge to pt and made an appt with Dr. Nelly Rout on 11/03/13 at 10am. Pt will do labs morning of. Labs in epic. Mailed out appt reminder.   ____________________________________________________________________________________    Date: 09/24/2013  Type: Medical info  Entry By: Janett Billow  Re: 09/22/2013 outside note to scan of type  - Outside Records/General: Disclosing health information authorization form.  ____________________________________________________________________________________    Date: 09/24/2013  Type: Medical info  Entry By: Janett Billow  Re: 08/26/2013 outside note to scan of type  - Outside Lab/Pathology: Progress notes/labs from [facility].  ____________________________________________________________________________________    Date: 09/23/2013  Type: Medical info  Entry ByMarland Kitchen Haydee Monica  RTC 9/16, labs in two weeks by 9/7  ____________________________________________________________________________________    Date: 09/22/2013  Type: Medical info  Entry By: Haydee Monica  Records request for HLA/DSA records from the [facility]  ____________________________________________________________________________________    Date: 09/10/2013  Type: Action  Entry By: Candy Sledge to pt and scheduled appt with Dr. Wynetta Emery on 09/22/13 at [facility] for 79mns. Pt will do labs morning of. Labs in epic. Mailed out appt reminder.  ____________________________________________________________________________________    Date: 09/09/2013  Type: Medical info  Entry By: lAniceto Boss New referral, pls schedule w/Dr. JWynetta Emery In TS  ____________________________________________________________________________________    Date: 09/01/2013  Type:  Referral  Entry By: [email or web address] Processed first post-kidney transplant referral. Fax notification sent to ref MD. Initial call made and patient was transferred to pre-Reg to update his insurance info. Pt is ready to be scheduled.

## 2019-09-15 IMAGING — US US RENAL
1 series · 13 of 25 positions shown · non-contrast
Comparison: None.

ADDENDUM:
The native kidneys are atrophic with diffusely echogenic parenchyma.
The right kidney measures 6.4 cm in length. The left kidney measures
7.3 cm in length. No solid renal masses are identified. There is a
0.6 cm echogenic focus with posterior acoustic shadowing in the
lower pole of the left kidney consistent with a small renal stone.
CLINICAL DATA: 31-year-old male status post living donor kidney
transplantation.

EXAM:
ULTRASOUND OF RENAL TRANSPLANT WITH RENAL DOPPLER ULTRASOUND
TECHNIQUE: Ultrasound examination of the renal transplant was performed with
gray-scale, color and duplex doppler evaluation.

[Series 1: us renal · 0.26mm/px · 13 of 41 slices shown]
[im 1/41]
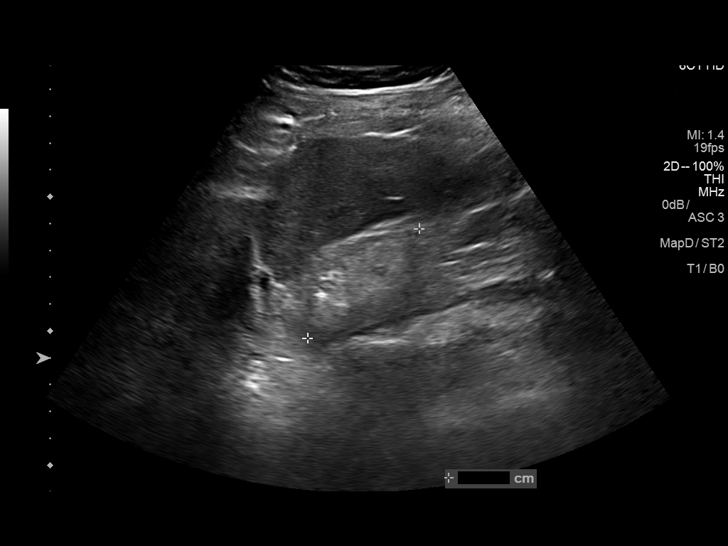
[im 4/41]
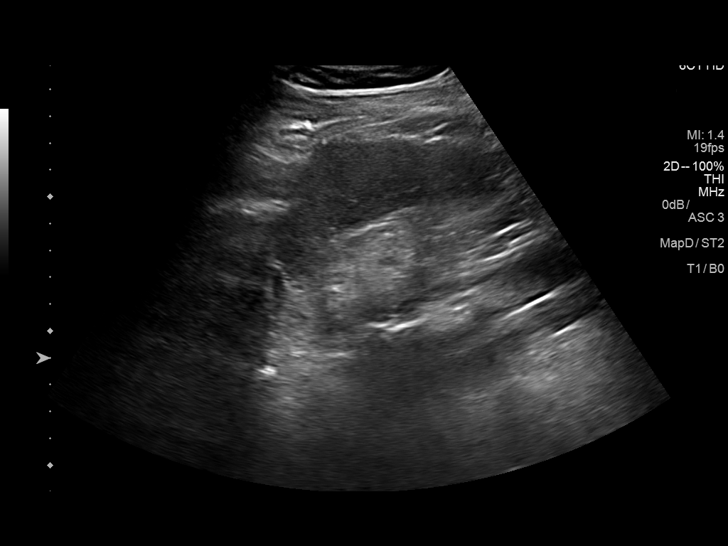
[im 7/41]
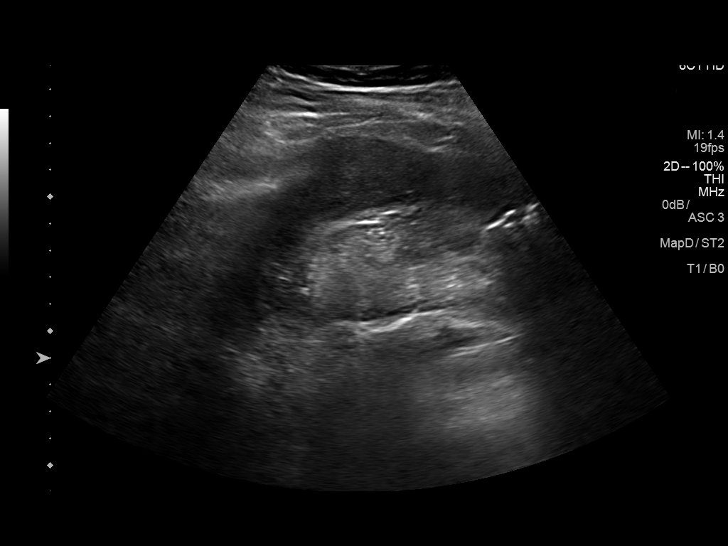
[im 11/41]
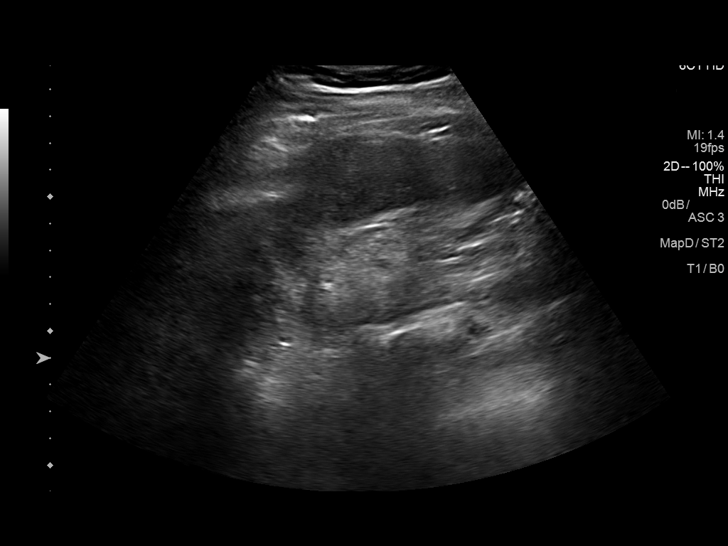
[im 14/41]
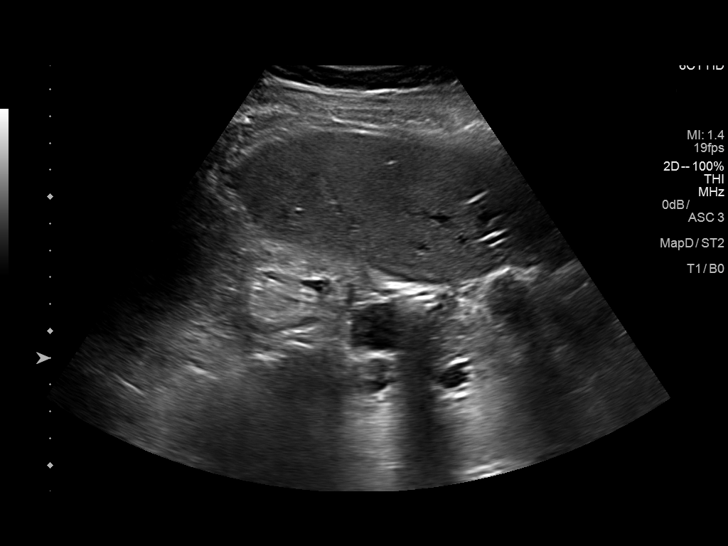
[im 17/41]
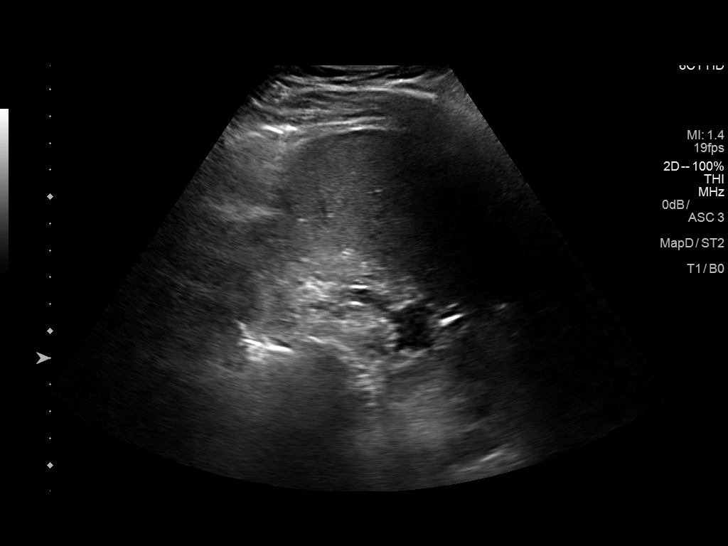
[im 21/41]
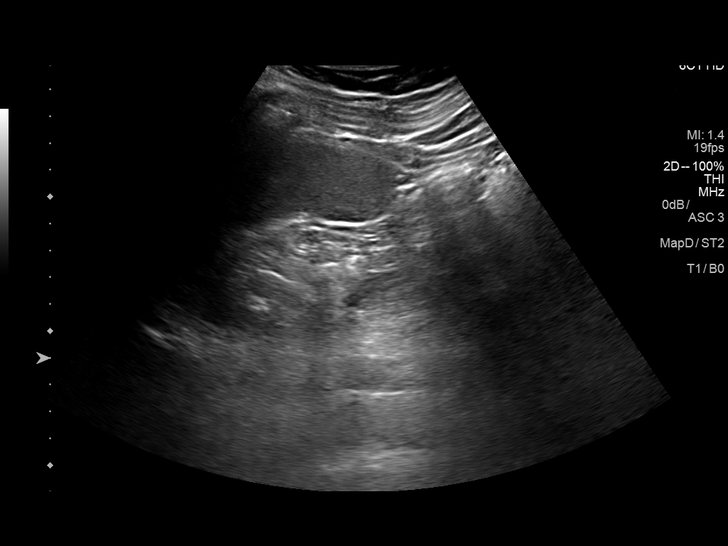
[im 24/41]
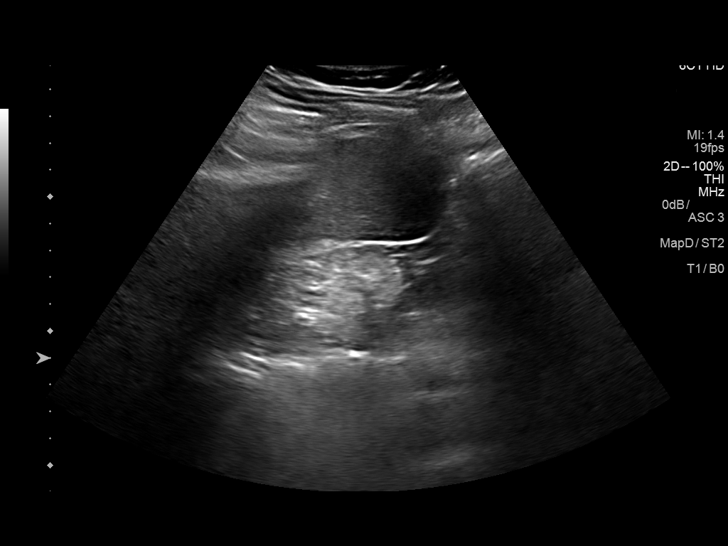
[im 27/41]
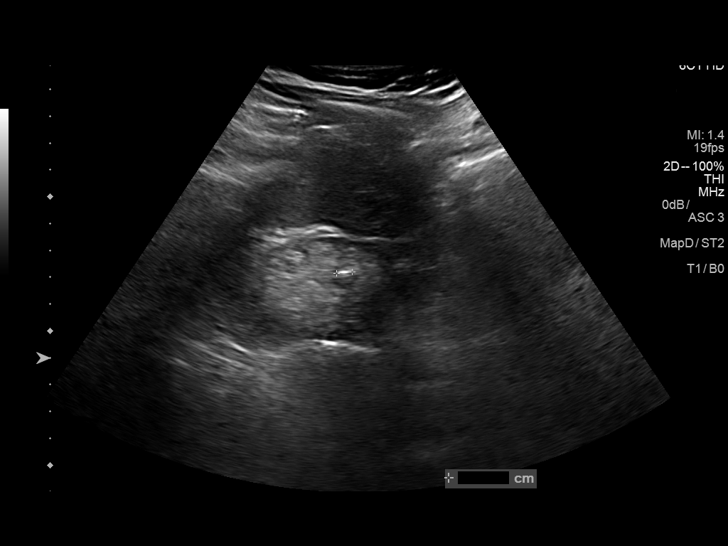
[im 31/41]
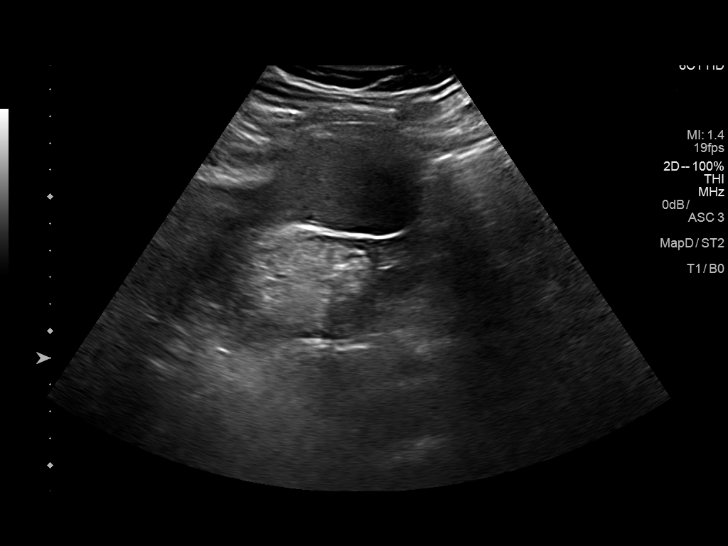
[im 34/41]
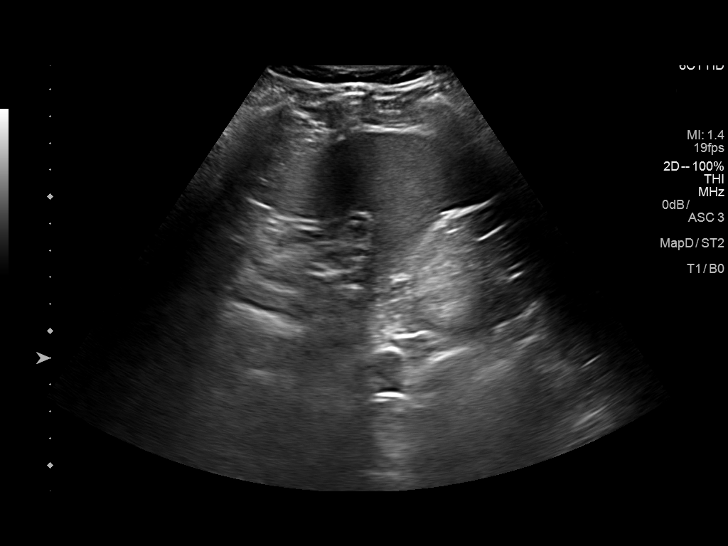
[im 37/41]
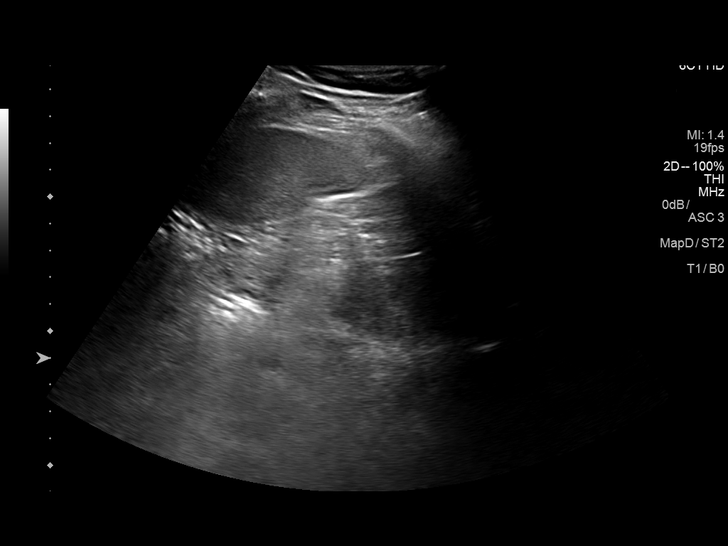
[im 41/41]
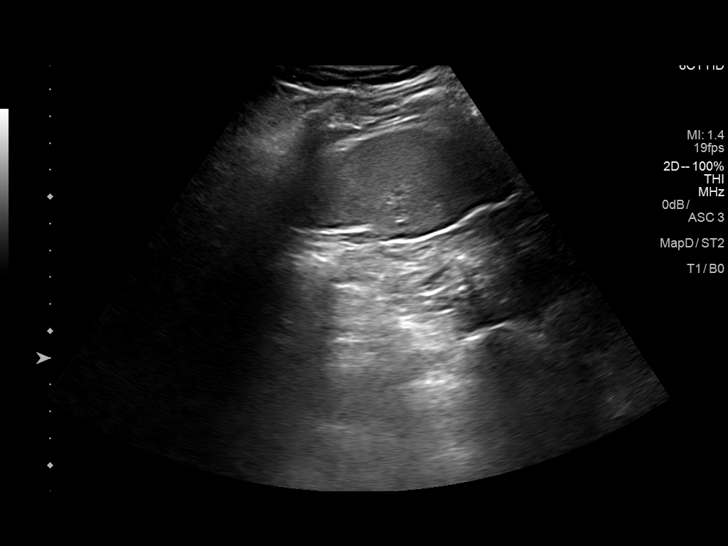

[13 of 25 positions shown; findings below may reference images not displayed]

IMPRESSION: 1. Atrophic native kidneys with diffusely echogenic renal
parenchyma.
2. Small 6 mm stone in the left lower pole collecting system.
FINDINGS: Transplant kidney location: RLQ

Transplant Kidney:

Length: 10.8 cm. Normal in size and parenchymal echogenicity. No
evidence of mass or hydronephrosis. No peri-transplant fluid
collection seen.

Color flow in the main renal artery:  Yes

Color flow in the main renal vein:  Yes

Duplex Doppler Evaluation:

Main Renal Artery Resistive Index:

Venous waveform in main renal vein:  Normal.

Intrarenal resistive index in upper pole:

(normal 0.6-0.8; equivocal 0.8-0.9; abnormal >= 0.9)

Intrarenal resistive index in lower pole:

(normal 0.6-0.8; equivocal 0.8-0.9; abnormal >= 0.9)

Bladder: Normal for degree of bladder distention.

Other findings:  None.
IMPRESSION: Normal sonographic evaluation of the right lower quadrant living
donor renal transplant.

## 2023-02-18 ENCOUNTER — Telehealth (HOSPITAL_BASED_OUTPATIENT_CLINIC_OR_DEPARTMENT_OTHER): Payer: Self-pay | Admitting: Unknown Physician Specialty

## 2023-02-18 NOTE — Telephone Encounter (Signed)
Received referral from Dr. 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 Last labs drawn 02/06/23, creatinine 1.32, electrolytes WNL. Creatinine has been stable in 1.4-1.6 range over the last 2 years, which is an improvement from his previous baseline of 1.8-2.0.    Current immunos:  - tacrolimus 1 mg / 0.5 mg  - MMF 1000 mg / 1000 mg   - no prednisone     Pt was transplanted at the [facility] [place] on 11/11/2012, but has been seen in [facility] post-kidney clinic, last seen by Dr. Assunta Found in 2020. Will send to MD for review/scheduling instructions.    Routing to MD for review - we will get records uploaded into Epic media tab.     Routing to PCs for follow up - since pt is established, would probably be ok with a 30 minute return visit w/full LTFU labs, DSA, and prospera/allosure based on MD preference, but please check with MD. Please notify referring provider office once pt is scheduled. Thank you!    Appt task created in checklist to track referral.

## 2023-03-31 ENCOUNTER — Telehealth (HOSPITAL_BASED_OUTPATIENT_CLINIC_OR_DEPARTMENT_OTHER): Payer: Self-pay

## 2023-03-31 NOTE — Telephone Encounter (Signed)
 Contacted patient to schedule a new patient appointment. Pt did not answer the call, left a voicemail requesting to return call for scheduling.

## 2023-04-14 ENCOUNTER — Other Ambulatory Visit (HOSPITAL_BASED_OUTPATIENT_CLINIC_OR_DEPARTMENT_OTHER): Payer: Self-pay

## 2023-04-14 DIAGNOSIS — Z940 Kidney transplant status: Secondary | ICD-10-CM

## 2023-04-14 DIAGNOSIS — T8690 Unspecified complication of unspecified transplanted organ and tissue: Secondary | ICD-10-CM

## 2023-04-14 DIAGNOSIS — E559 Vitamin D deficiency, unspecified: Secondary | ICD-10-CM

## 2023-04-14 NOTE — Telephone Encounter (Signed)
 New referral from Dr. Elpidio Galea for immuno review and discussion on reduction. OSH transplant in 2014 and last saw Dr. Assunta Found in 2020. MD agreed to schedule the patient at the next available time slot.    Appointment: Scheduled: April 2nd at 11:00 AM with Dr. Assunta Found  Labs: Entered in Epic Ordered: LTFU, DSA (No cfDNA required per MD)    Patient agreed to complete lab draws on the morning of the appointment.  An appointment reminder has been mailed to the patient with additional scheduling details.

## 2023-05-07 ENCOUNTER — Encounter (HOSPITAL_BASED_OUTPATIENT_CLINIC_OR_DEPARTMENT_OTHER): Payer: Self-pay | Admitting: Nephrology

## 2023-05-07 ENCOUNTER — Ambulatory Visit: Payer: PRIVATE HEALTH INSURANCE | Attending: Nephrology | Admitting: Nephrology

## 2023-05-07 ENCOUNTER — Other Ambulatory Visit (HOSPITAL_BASED_OUTPATIENT_CLINIC_OR_DEPARTMENT_OTHER): Payer: Self-pay

## 2023-05-07 VITALS — BP 125/77 | HR 83 | Temp 97.9°F | Ht 66.26 in | Wt 160.5 lb

## 2023-05-07 DIAGNOSIS — D509 Iron deficiency anemia, unspecified: Secondary | ICD-10-CM | POA: Insufficient documentation

## 2023-05-07 DIAGNOSIS — T8690 Unspecified complication of unspecified transplanted organ and tissue: Secondary | ICD-10-CM | POA: Insufficient documentation

## 2023-05-07 DIAGNOSIS — E559 Vitamin D deficiency, unspecified: Secondary | ICD-10-CM

## 2023-05-07 DIAGNOSIS — Z940 Kidney transplant status: Secondary | ICD-10-CM | POA: Insufficient documentation

## 2023-05-07 DIAGNOSIS — I10 Essential (primary) hypertension: Secondary | ICD-10-CM | POA: Insufficient documentation

## 2023-05-07 DIAGNOSIS — D849 Immunodeficiency, unspecified: Secondary | ICD-10-CM | POA: Insufficient documentation

## 2023-05-07 DIAGNOSIS — D84821 Immunodeficiency due to drugs: Secondary | ICD-10-CM | POA: Insufficient documentation

## 2023-05-07 DIAGNOSIS — Z48298 Encounter for aftercare following other organ transplant: Secondary | ICD-10-CM | POA: Insufficient documentation

## 2023-05-07 DIAGNOSIS — Z79899 Other long term (current) drug therapy: Secondary | ICD-10-CM | POA: Insufficient documentation

## 2023-05-07 DIAGNOSIS — E785 Hyperlipidemia, unspecified: Secondary | ICD-10-CM | POA: Insufficient documentation

## 2023-05-07 LAB — URINALYSIS WITH REFLEX CULTURE
Bacteria, URN: NONE SEEN
Bilirubin (Qual), URN: NEGATIVE
Epith Cells_Renal/Trans,URN: NEGATIVE /HPF
Epith Cells_Squamous, URN: NEGATIVE /LPF
Glucose Qual, URN: NEGATIVE
Ketones, URN: NEGATIVE
Leukocyte Esterase, URN: NEGATIVE
Nitrite, URN: NEGATIVE
Occult Blood, URN: NEGATIVE
Protein (Alb Semiquant), URN: NEGATIVE
RBC, URN: NEGATIVE /HPF
Specific Gravity, URN: 1.004 g/mL — ABNORMAL LOW (ref 1.006–1.027)
WBC, URN: NEGATIVE /HPF
pH, URN: 5.5 (ref 5.0–8.0)

## 2023-05-07 LAB — CBC, DIFF
% Basophils: 1 %
% Eosinophils: 3 %
% Immature Granulocytes: 1 %
% Lymphocytes: 28 %
% Monocytes: 6 %
% Neutrophils: 61 %
% Nucleated RBC: 0 %
Absolute Eosinophil Count: 0.28 10*3/uL (ref 0.00–0.50)
Absolute Lymphocyte Count: 2.59 10*3/uL (ref 1.00–4.80)
Basophils: 0.07 10*3/uL (ref 0.00–0.20)
Hematocrit: 41 % (ref 38.0–50.0)
Hemoglobin: 13.8 g/dL (ref 13.0–18.0)
Immature Granulocytes: 0.05 10*3/uL (ref 0.00–0.05)
MCH: 26.4 pg — ABNORMAL LOW (ref 27.3–33.6)
MCHC: 34 g/dL (ref 32.2–36.5)
MCV: 78 fL — ABNORMAL LOW (ref 81–98)
Monocytes: 0.54 10*3/uL (ref 0.00–0.80)
Neutrophils: 5.79 10*3/uL (ref 1.80–7.00)
Nucleated RBC: 0 10*3/uL
Platelet Count: 194 10*3/uL (ref 150–400)
RBC: 5.23 10*6/uL (ref 4.40–5.60)
RDW-CV: 12.6 % (ref 11.0–14.5)
WBC: 9.32 10*3/uL (ref 4.3–10.0)

## 2023-05-07 LAB — COMPREHENSIVE METABOLIC PANEL
ALT (GPT): 15 U/L (ref 10–64)
AST (GOT): 15 U/L (ref 9–38)
Albumin: 4.6 g/dL (ref 3.5–5.2)
Alkaline Phosphatase (Total): 91 U/L (ref 36–122)
Anion Gap: 10 (ref 4–12)
Bilirubin (Total): 0.5 mg/dL (ref 0.2–1.3)
Calcium: 9.6 mg/dL (ref 8.9–10.2)
Carbon Dioxide, Total: 23 meq/L (ref 22–32)
Chloride: 104 meq/L (ref 98–108)
Creatinine: 1.63 mg/dL — ABNORMAL HIGH (ref 0.51–1.18)
Glucose: 123 mg/dL (ref 62–125)
Potassium: 3.9 meq/L (ref 3.6–5.2)
Protein (Total): 7.8 g/dL (ref 6.0–8.2)
Sodium: 137 meq/L (ref 135–145)
Urea Nitrogen: 19 mg/dL (ref 8–21)
eGFR by CKD-EPI 2021: 56 mL/min/{1.73_m2} — ABNORMAL LOW (ref >59)

## 2023-05-07 LAB — IRON BINDING CAPACITY (W/IRON, TRANSFERRIN & TRANSF SAT)
Iron, SRM: 85 ug/dL (ref 31–171)
Total Iron Binding Capacity: 407 ug/dL (ref 250–460)
Transferrin Saturation: 21 % (ref 15–50)
Transferrin: 291 mg/dL (ref 180–329)

## 2023-05-07 LAB — LIPID PANEL
Cholesterol/HDL Ratio: 4.2
HDL Cholesterol: 33 mg/dL — ABNORMAL LOW (ref >39)
LDL Cholesterol, NIH Equation: 54 mg/dL (ref <130)
Non-HDL Cholesterol: 105 mg/dL (ref 0–159)
Total Cholesterol: 138 mg/dL (ref <200)
Triglyceride: 333 mg/dL — ABNORMAL HIGH (ref <150)

## 2023-05-07 LAB — LAB ADD ON ORDER

## 2023-05-07 LAB — TACROLIMUS BY LCMSMS: Tacrolimus By LCMSMS: 6.4 ng/mL

## 2023-05-07 LAB — FOLATE & VITAMIN B12
Folate, SRM: 5.6 ng/mL — ABNORMAL LOW (ref >5.8)
Vitamin B12 (Cobalamin): 172 pg/mL — ABNORMAL LOW (ref 180–914)

## 2023-05-07 LAB — MAGNESIUM: Magnesium: 1.5 mg/dL — ABNORMAL LOW (ref 1.8–2.4)

## 2023-05-07 LAB — PROTEIN/CREATININE RATIO, RANDOM URINE
Creatinine/Unit, Urine: 25 mg/dL
Protein (Total), Urine: <4 mg/dL
Protein/Creatinine Ratio: <0.2 (ref <0.2)

## 2023-05-07 LAB — PHOSPHATE: Phosphate: 4.1 mg/dL (ref 2.5–4.5)

## 2023-05-07 LAB — PARATHYROID HORMONE: Parathyroid Hormone: 62 pg/mL (ref 12–88)

## 2023-05-07 LAB — FERRITIN: Ferritin: 42 ng/mL (ref 20–230)

## 2023-05-07 NOTE — Progress Notes (Signed)
 CHIEF CONCERN:  Mr. Depaulo returns for a consult visit, with last visit the 4 years ago, and remains under the care of Dr. Elpidio Galea.  He reports concerns regarding long-term immunosuppression adverse effects, including hair loss with tacrolimus, with interest in safely minimizing immunosuppression.    HISTORY OF PRESENT ILLNESS:  Mr. 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  Please see my note from 05/31/2015 for further details.      Since I last saw him in 2020, he denies any health changes, working full-time, without episodes of AKI or rejection; he underwent a biopsy at [facility], which he reports to be normal.  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  He previously was switched off tacrolimus due to concern for hair loss, but is now satisfied with the combination of tacrolimus plus minoxidil 2.5 mg twice daily to prevent hair loss.      Home blood pressures 120s over 70s to 80s.  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  He has not been exercising consistently in the midst of his work and parenting responsibilities.    No recent surgeries.  Please see prior H&P for past history including transplant.  Denies GI and GU symptoms.    ALLERGIES:  NO KNOWN DRUG ALLERGIES.    MEDICATIONS:  Reviewed and updated in Epic.    Past medical history:  End-stage kidney disease of unclear etiology  Hypertension  Hyperlipidemia    Past surgical history:  Living related kidney transplant at the [facility] [place] on 11/11/2012 from his brother.  ATG induction, steroid sparing regimen.  See my note from 05/31/2015 for further details.    SOCIAL  HISTORY:  The patient is now living in [place] with his extended family including parents, brother and brother's family, sister and sister's family, his wife and 2 children ages 2 and 6 years.  No tobacco or drug use.    FAMILY HISTORY:  Notable for parents both with diabetes.  No family history of chronic kidney disease.    REVIEW OF SYSTEMS:  Notable for resolved gingival hyperplasia.  No fevers, chills, night sweats, cough, shortness of breath, chest discomfort, abdominal pain, nausea, vomiting, urologic symptoms including dysuria, hematuria, frequency or urgency.  The remainder of a review of systems is negative.    PHYSICAL EXAMINATION:  Vitals:    05/07/23 1112   Temp: 36.6 C   Pulse: 83   BP: 125/77   SpO2: 99%   Height: 5' 6.26" (1.683 m)   Weight: 72.8 kg (160 lb 7.9 oz)   GENERAL:  He is a well-appearing 38 year old in no acute distress.  HEENT:  Reveals anicteric sclerae and moist mucosa with improved gingival hyperplasia, which is nonerythematous and without exudate.  Dentition otherwise appears intact.  NECK:  Supple, without lymphadenopathy, jugular venous distention, or carotid bruits bilaterally.  LUNGS:  Clear to auscultation without rales or rhonchi.  HEART:  Regular rate and rhythm, S1, S2, without murmur or rub.  ABDOMEN:  Soft, nontender, nondistended with normoactive bowel sounds.  Allograft in the right lower quadrant is nontender and without bruit.  EXTREMITIES:  Reveal no clubbing, cyanosis or edema.  NEUROLOGIC:  Grossly intact with normal strength and sensation.  No tremor.  SKIN:  Negative for rash and ulcer.    LABORATORY DATA:  Reviewed with the patient today:  Serum creatinine 1.6.  Urinalysis without proteinuria or hematuria.  Triglycerides, fasting, 333, HDL 33, LDL 54.  Hemoglobin 13.8, WBC 9.3, platelet 194.  Tacrolimus 6.4  DSA pending    IMPRESSION AND PLAN:  Mr. Gum is a 38 year old man with a history of end-stage kidney disease of unclear etiology, status post living  related kidney transplant from his brother 11/11/2012 at the [facility] [place].  Baseline serum creatinine stable 1.6-1.8 without proteinuria.  DSA currently pending.  The patient is interested in further minimizing his immunosuppression due to concerns regarding side effects, such as hair loss.    1.  Renal allograft function.  Serum creatinine is at baseline, without proteinuria.  He appears to be slightly volume depleted today.  Previous DSA from 10/2013 was negative.  -I advised again that he increase his liquid intake to 2.5 liters per day  -Await HLA match and DSA results prior to potential decrease in MMF; see below.    2.  Immunosuppression.  CPRA and HLA match unknown.  ATG induction was 5.6 mg/kg with a steroid-free regimen.  -Continue tacrolimus 1 mg twice daily, for trough goal 4-6.  I do not advise discontinuing tacrolimus/CNI, especially since he is not interested in belatacept and if he is on a 1 or 2 drug regimen to maintain long-term allograft function.  -Continue MMF 1000 mg twice daily for now.  Will consider decreasing to 500 mg twice daily, and subsequently lower doses depending on HLA match and DSA.  -We will attempt to obtain primary HLA data from donor and recipient along with today's DSA result to inform these decisions.    3.  Hypertension.  At goal less than 130/80.  -Advised that he restart exercise, goal 20 to 30 minutes 5 days/week  -Maintain low-salt vegetable-based diet.    4.  Hypomagnesemia.  Stable  -Continue a high magnesium diet without supplement.    5.  Iron deficiency anemia.  Normal hemoglobin, with low MCV.  -Will check ferritin, iron studies, folate and B12 today.    6.  Health maintenance.  He will benefit from an annual influenza vaccination.      #Dyslipidemia, with hypertriglyceridemia:  He will additionally benefit from followup of his lipids, and potentially further treatment with medications.    # Elevated fasting glucose.  He is higher risk for diabetes in the  context of his medications, diet, and family history.    Advised exercise regularly  -Recheck fasting glucose with Dr. Elpidio Galea within the next 3 months.    He will benefit from annual dermatology screening for skin cancer.    We will plan on seeing Mr. Facemire in two years, with labs every 3 months per Dr. Elpidio Galea.  I will be available for any consultation that may be of assistance to his care in the interim.    Jarome Lamas, MD  Departments of Medicine & Pediatrics  Division of Nephrology & Transplantation  [place] [facility]  Pager [phone]

## 2023-05-08 ENCOUNTER — Telehealth (HOSPITAL_BASED_OUTPATIENT_CLINIC_OR_DEPARTMENT_OTHER): Payer: Self-pay

## 2023-05-08 NOTE — Telephone Encounter (Addendum)
 MyChart message sent to pt.     ----- Message from Avelina Laine sent at 05/08/2023  4:36 PM PDT -----  Please call the patient regarding his abnormal test result(s). Low MCV is likely due to low folate, B12 and possibly iron.     Please advise the patient to take a multivitamin 1 tablet daily with Folate and Vitamin B12. Also advise increased iron intake in diet = dark greens, nuts, beans, lentils, eggs, fish, red meat.    Recheck Folate, Vitamin B12, Ferritin and Iron Studies with Dr Elpidio Galea in 3 month(s).    Please share these lab results and plan with Dr Elpidio Galea for her follow-up. Thank you.    Note:  KP Standard = CMP, CBC with diff, mag, phos, tacrolimus, amylase, lipase  LTFU = CMP, CBC with diff, mag, phos, tacrolimus along with PTH, Vit D (25-0h)    Current Meds:       Encounter Labs:  Results for orders placed or performed in visit on 05/07/23  -Urinalysis with Reflex Culture:   Collection Time: 05/07/23  8:22 AM  Specimen: Urine       Result                      Value                           Color, URN                  Pale                            Clarity, URN                Clear                           Specific Gravity, URN       1.004 (L)                       pH, URN                     5.5                             Protein (Alb Semiquant#     Negative                        Glucose Qual, URN           Negative                        Ketones, URN                Negative                        Bilirubin (Qual), URN       Negative                        Occult Blood, URN           Negative                        Nitrite,  URN                Negative                        Leukocyte Esterase, URN     Negative                        Urobilinogen, URN           0.1-1.9                         Comments for Macroscop#     None                            Collection Method           Urine                           WBC, URN                    0-5(NEG)                        RBC, URN                     0-2(NEG)                        Bacteria, URN               Not Seen                        Epith Cells_Squamous, #     0-5(NEG)                        Epith Cells_Renal/Tran#     <3(NEG)                         Comments For Microscop#     None                            1st Extra Urine Wallace Cullens T#                                 Additional collection tube  -Protein/Creatinine Ratio, Random Urine:   Collection Time: 05/07/23  8:22 AM       Result                      Value                           Protein (Total), Urine      <4                              Creatinine/Unit, Urine      25                              Protein/Creatinine Rat#     <  0.2                         Comment:    Results and reference range are reported in mg protein/mg creatinine.  -Parathyroid Hormone:   Collection Time: 05/07/23  8:32 AM       Result                      Value                           Parathyroid Hormone         62                         -Lipid Panel:   Collection Time: 05/07/23  8:32 AM       Result                      Value                           Total Cholesterol           138                             Triglyceride                333 (H)                         HDL Cholesterol             33 (L)                          Non-HDL Cholesterol         105                             LDL Cholesterol, NIH E#     54                              Cholesterol/HDL Ratio       4.2                             Lipid Panel, Additiona#     (NOTE)                       Comment:    For complete information to help interpret the lipid panel, please     use the National Cholesterol Education Program (NCEP) guideline based     reference range comments found here:    https://testguide.labmed.http://www.hawkins.com/              -Tacrolimus Level:   Collection Time: 05/07/23  8:32 AM       Result                      Value                           Tacrolimus By [facility]  6.4                             Tacrolimus Information                                   Whole blood Tacrolimus was measured by LCMSMS.    Comment:             A broad consensus therapeutic range for 12-hour trough tacrolimus (FK506)     concentrations in whole blood is 5-20 ng/mL. Other target ranges may be     specified by various protocols, depending on the type of transplant, time     since transplant, concomitant immunosuppressive therapy and sampling time.             This test was developed and its performance characteristics determined by the     [facility] Department of Laboratory Medicine and Pathology. It     has not been cleared or approved by the [place] Food and Drug Administration.    This laboratory is certified under the Clinical Laboratory Improvement     Amendments (CLIA) as qualified to perform high complexity clinical laboratory     testing. This test is used for clinical purposes. It should not be regarded as     investigational or for research.      -Phosphate:   Collection Time: 05/07/23  8:32 AM       Result                      Value                           Phosphate                   4.1                        -Magnesium:   Collection Time: 05/07/23  8:32 AM       Result                      Value                           Magnesium                   1.5 (L)                    -Comprehensive Metabolic Panel:   Collection Time: 05/07/23  8:32 AM       Result                      Value                           Sodium                      137                             Potassium                   3.9  Chloride                    104                             Carbon Dioxide, Total       23                              Anion Gap                   10                              Glucose                     123                             Urea Nitrogen               19                              Creatinine                  1.63 (H)                        Protein (Total)             7.8                              Albumin                     4.6                             Bilirubin (Total)           0.5                             Calcium                     9.6                             AST (GOT)                   15                              Alkaline Phosphatase (#     91                              ALT (GPT)                   15                              eGFR  by CKD-EPI 2021        56 (L)                     -CBC with Diff:   Collection Time: 05/07/23  8:32 AM       Result                      Value                           WBC                         9.32                            RBC                         5.23                            Hemoglobin                  13.8                            Hematocrit                  41                              MCV                         78 (L)                          MCH                         26.4 (L)                        MCHC                        34.0                            Platelet Count              194                             RDW-CV                      12.6                            % Neutrophils               61                              % Lymphocytes  28                              % Monocytes                 6                               % Eosinophils               3                               % Basophils                 1                               % Immature Granulocytes     1                               Neutrophils                 5.79                            Absolute Lymphocyte Co#     2.59                            Monocytes                   0.54                            Absolute Eosinophil Co#     0.28                            Basophils                   0.07                            Immature Granulocytes       0.05                            Nucleated RBC               0.00                            % Nucleated RBC             0                          -Ferritin:   Collection Time: 05/07/23   8:32 AM       Result                      Value  Ferritin                    42                         -Folate and Vitamin B12:   Collection Time: 05/07/23  8:32 AM       Result                      Value                           Folate, SRM                 5.6 (L)                      Comment:    Reference range >5.8 ng/mL Folate deficiency <4.0 ng/mL       Vitamin B12 (Cobalamin)     172 (L)                      Comment:           Normal:  180-914 pg/mL    Indeterminate:  145-179 pg/mL        Deficient:  <145 pg/mL      -Iron Binding Capacity (w/Iron, Transferrin & Transf Sat):   Collection Time: 05/07/23  8:32 AM       Result                      Value                           Transferrin                 291                             Iron, SRM                   85                              Total Iron Binding Cap#     407                             Transferrin Saturation      21                             Jarome Lamas, MD  Department of Medicine  Division of Nephrology & Transplantation  [place] [facility] [place]  [facility]  [facility]  Pager [phone]

## 2023-05-08 NOTE — Result Encounter Note (Signed)
 Please call the patient regarding his abnormal test result(s). Low MCV is likely due to low folate, B12 and possibly iron.     Please advise the patient to take a multivitamin 1 tablet daily with Folate and Vitamin B12. Also advise increased iron intake in diet = dark greens, nuts, beans, lentils, eggs, fish, red meat.    Recheck Folate, Vitamin B12, Ferritin and Iron Studies with Dr Elpidio Galea in 3 month(s).    Please share these lab results and plan with Dr Elpidio Galea for her follow-up. Thank you.    Note:  KP Standard = CMP, CBC with diff, mag, phos, tacrolimus, amylase, lipase  LTFU = CMP, CBC with diff, mag, phos, tacrolimus along with PTH, Vit D (25-0h)    Current Meds:      Encounter Labs:  Results for orders placed or performed in visit on 05/07/23  -Urinalysis with Reflex Culture:   Collection Time: 05/07/23  8:22 AM  Specimen: Urine       Result                      Value                           Color, URN                  Pale                            Clarity, URN                Clear                           Specific Gravity, URN       1.004 (L)                       pH, URN                     5.5                             Protein (Alb Semiquant*     Negative                        Glucose Qual, URN           Negative                        Ketones, URN                Negative                        Bilirubin (Qual), URN       Negative                        Occult Blood, URN           Negative                        Nitrite, URN                Negative  Leukocyte Esterase, URN     Negative                        Urobilinogen, URN           0.1-1.9                         Comments for Macroscop*     None                            Collection Method           Urine                           WBC, URN                    0-5(NEG)                        RBC, URN                    0-2(NEG)                        Bacteria, URN               Not Seen                        Epith  Cells_Squamous, *     0-5(NEG)                        Epith Cells_Renal/Tran*     <3(NEG)                         Comments For Microscop*     None                            1st Extra Urine Wallace Cullens T*                                 Additional collection tube  -Protein/Creatinine Ratio, Random Urine:   Collection Time: 05/07/23  8:22 AM       Result                      Value                           Protein (Total), Urine      <4                              Creatinine/Unit, Urine      25                              Protein/Creatinine Rat*     <0.2                         Comment:    Results and reference range are reported in mg protein/mg creatinine.  -  Parathyroid Hormone:   Collection Time: 05/07/23  8:32 AM       Result                      Value                           Parathyroid Hormone         62                         -Lipid Panel:   Collection Time: 05/07/23  8:32 AM       Result                      Value                           Total Cholesterol           138                             Triglyceride                333 (H)                         HDL Cholesterol             33 (L)                          Non-HDL Cholesterol         105                             LDL Cholesterol, NIH E*     54                              Cholesterol/HDL Ratio       4.2                             Lipid Panel, Additiona*     (NOTE)                       Comment:    For complete information to help interpret the lipid panel, please     use the National Cholesterol Education Program (NCEP) guideline based     reference range comments found here:    https://testguide.labmed.http://www.hawkins.com/              -Tacrolimus Level:   Collection Time: 05/07/23  8:32 AM       Result                      Value                           Tacrolimus By LCMSMS        6.4                             Tacrolimus Information  Whole blood Tacrolimus was measured by LCMSMS.    Comment:              A broad consensus therapeutic range for 12-hour trough tacrolimus (FK506)     concentrations in whole blood is 5-20 ng/mL. Other target ranges may be     specified by various protocols, depending on the type of transplant, time     since transplant, concomitant immunosuppressive therapy and sampling time.             This test was developed and its performance characteristics determined by the     [facility] Department of Laboratory Medicine and Pathology. It     has not been cleared or approved by the [place] Food and Drug Administration.    This laboratory is certified under the Clinical Laboratory Improvement     Amendments (CLIA) as qualified to perform high complexity clinical laboratory     testing. This test is used for clinical purposes. It should not be regarded as     investigational or for research.      -Phosphate:   Collection Time: 05/07/23  8:32 AM       Result                      Value                           Phosphate                   4.1                        -Magnesium:   Collection Time: 05/07/23  8:32 AM       Result                      Value                           Magnesium                   1.5 (L)                    -Comprehensive Metabolic Panel:   Collection Time: 05/07/23  8:32 AM       Result                      Value                           Sodium                      137                             Potassium                   3.9                             Chloride                    104  Carbon Dioxide, Total       23                              Anion Gap                   10                              Glucose                     123                             Urea Nitrogen               19                              Creatinine                  1.63 (H)                        Protein (Total)             7.8                             Albumin                     4.6                             Bilirubin (Total)           0.5                              Calcium                     9.6                             AST (GOT)                   15                              Alkaline Phosphatase (*     91                              ALT (GPT)                   15                              eGFR by CKD-EPI 2021        56 (L)                     -CBC with Diff:   Collection Time: 05/07/23  8:32 AM  Result                      Value                           WBC                         9.32                            RBC                         5.23                            Hemoglobin                  13.8                            Hematocrit                  41                              MCV                         78 (L)                          MCH                         26.4 (L)                        MCHC                        34.0                            Platelet Count              194                             RDW-CV                      12.6                            % Neutrophils               61                              % Lymphocytes               28                              % Monocytes  6                               % Eosinophils               3                               % Basophils                 1                               % Immature Granulocytes     1                               Neutrophils                 5.79                            Absolute Lymphocyte Co*     2.59                            Monocytes                   0.54                            Absolute Eosinophil Co*     0.28                            Basophils                   0.07                            Immature Granulocytes       0.05                            Nucleated RBC               0.00                            % Nucleated RBC             0                          -Ferritin:   Collection Time: 05/07/23  8:32 AM       Result                      Value                           Ferritin                     42                         -  Folate and Vitamin B12:   Collection Time: 05/07/23  8:32 AM       Result                      Value                           Folate, SRM                 5.6 (L)                      Comment:    Reference range >5.8 ng/mL Folate deficiency <4.0 ng/mL       Vitamin B12 (Cobalamin)     172 (L)                      Comment:           Normal:  180-914 pg/mL    Indeterminate:  145-179 pg/mL        Deficient:  <145 pg/mL      -Iron Binding Capacity (w/Iron, Transferrin & Transf Sat):   Collection Time: 05/07/23  8:32 AM       Result                      Value                           Transferrin                 291                             Iron, SRM                   85                              Total Iron Binding Cap*     407                             Transferrin Saturation      21                             Jarome Lamas, MD  Department of Medicine  Division of Nephrology & Transplantation  [place] [facility] [place]  [facility]  [facility]  Pager [phone]

## 2023-05-09 LAB — BK VIRUS PCR QUANT, URINE
BK Virus Urine Quant: 2270 [IU]/mL — AB
BK Virus Urine log IU/mL: 3.4

## 2023-05-10 LAB — VITAMIN D (25 HYDROXY)
Vit D (25_Hydroxy) Total: 34.3 ng/mL (ref 20.1–50.0)
Vitamin D2 (25_Hydroxy): <1 ng/mL
Vitamin D3 (25_Hydroxy): 34.3 ng/mL

## 2023-05-12 LAB — HLA ANTIBODY MONITORING DSA, ROUTINE (SENDOUT)

## 2023-05-12 NOTE — Result Encounter Note (Signed)
 Based on lab results from this date, no immunosuppression or other care plan changes indicated.    Current Meds:      Encounter Labs:  Results for orders placed or performed in visit on 05/07/23  -BK Virus PCR Quant, Urine:   Collection Time: 05/07/23  8:22 AM       Result                      Value                           BK Virus Urine Quant        2,270 (A)                       BK Virus Urine log IU/*     3.4                             BK Virus Urine Quant I*     SEE NOTES                    Comment:    The lower limit of detection, LoD (the minimum virus level that gives a     positive result in 95% of replicates), is 12.2 IU/mL, and the lower limit of     quantitation (LLoQ) is 200 IU/mL.    A conversion factor of 1.0 Log10 IU/mL is equivalent to 1.0 Log10 copies/mL.    The Roche cobas BKV assay uses primers specific for the BKV VP2 region and the     BKV small t-antigen region.      -Urinalysis with Reflex Culture:   Collection Time: 05/07/23  8:22 AM  Specimen: Urine       Result                      Value                           Color, URN                  Pale                            Clarity, URN                Clear                           Specific Gravity, URN       1.004 (L)                       pH, URN                     5.5                             Protein (Alb Semiquant*     Negative                        Glucose Qual, URN           Negative  Ketones, URN                Negative                        Bilirubin (Qual), URN       Negative                        Occult Blood, URN           Negative                        Nitrite, URN                Negative                        Leukocyte Esterase, URN     Negative                        Urobilinogen, URN           0.1-1.9                         Comments for Macroscop*     None                            Collection Method           Urine                           WBC, URN                    0-5(NEG)                         RBC, URN                    0-2(NEG)                        Bacteria, URN               Not Seen                        Epith Cells_Squamous, *     0-5(NEG)                        Epith Cells_Renal/Tran*     <3(NEG)                         Comments For Microscop*     None                            1st Extra Urine Wallace Cullens T*                                 Additional collection tube  -Protein/Creatinine Ratio, Random Urine:   Collection Time: 05/07/23  8:22 AM       Result  Value                           Protein (Total), Urine      <4                              Creatinine/Unit, Urine      25                              Protein/Creatinine Rat*     <0.2                         Comment:    Results and reference range are reported in mg protein/mg creatinine.  -Vitamin D 25-OH COMMON Deficiency Level:   Collection Time: 05/07/23  8:32 AM       Result                      Value                           Vitamin D2 (25_Hydroxy)     <1.0                            Vitamin D3 (25_Hydroxy)     34.3                            Vit D (25_Hydroxy) Tot*     34.3                            Vit D (25_Hydroxy) Int*                                 Normal:            20.1-50.0 ng/mL    Comment:    For more information see https://tests.labmed.ArmyDictionary.fi  -Parathyroid Hormone:   Collection Time: 05/07/23  8:32 AM       Result                      Value                           Parathyroid Hormone         62                         -Lipid Panel:   Collection Time: 05/07/23  8:32 AM       Result                      Value                           Total Cholesterol           138                             Triglyceride  333 (H)                         HDL Cholesterol             33 (L)                          Non-HDL Cholesterol         105                             LDL Cholesterol, NIH E*     54                              Cholesterol/HDL Ratio       4.2                              Lipid Panel, Additiona*     (NOTE)                       Comment:    For complete information to help interpret the lipid panel, please     use the National Cholesterol Education Program (NCEP) guideline based     reference range comments found here:    https://testguide.labmed.http://www.hawkins.com/              -Tacrolimus Level:   Collection Time: 05/07/23  8:32 AM       Result                      Value                           Tacrolimus By LCMSMS        6.4                             Tacrolimus Information                                  Whole blood Tacrolimus was measured by LCMSMS.    Comment:             A broad consensus therapeutic range for 12-hour trough tacrolimus (FK506)     concentrations in whole blood is 5-20 ng/mL. Other target ranges may be     specified by various protocols, depending on the type of transplant, time     since transplant, concomitant immunosuppressive therapy and sampling time.             This test was developed and its performance characteristics determined by the     [facility] Department of Laboratory Medicine and Pathology. It     has not been cleared or approved by the [place] Food and Drug Administration.    This laboratory is certified under the Clinical Laboratory Improvement     Amendments (CLIA) as qualified to perform high complexity clinical laboratory     testing. This test is used for clinical purposes. It should not be regarded as     investigational or for research.      -Phosphate:   Collection Time: 05/07/23  8:32 AM  Result                      Value                           Phosphate                   4.1                        -Magnesium:   Collection Time: 05/07/23  8:32 AM       Result                      Value                           Magnesium                   1.5 (L)                    -Comprehensive Metabolic Panel:   Collection Time: 05/07/23  8:32 AM       Result                      Value                            Sodium                      137                             Potassium                   3.9                             Chloride                    104                             Carbon Dioxide, Total       23                              Anion Gap                   10                              Glucose                     123                             Urea Nitrogen               19                              Creatinine  1.63 (H)                        Protein (Total)             7.8                             Albumin                     4.6                             Bilirubin (Total)           0.5                             Calcium                     9.6                             AST (GOT)                   15                              Alkaline Phosphatase (*     91                              ALT (GPT)                   15                              eGFR by CKD-EPI 2021        56 (L)                     -CBC with Diff:   Collection Time: 05/07/23  8:32 AM       Result                      Value                           WBC                         9.32                            RBC                         5.23                            Hemoglobin                  13.8                            Hematocrit  41                              MCV                         78 (L)                          MCH                         26.4 (L)                        MCHC                        34.0                            Platelet Count              194                             RDW-CV                      12.6                            % Neutrophils               61                              % Lymphocytes               28                              % Monocytes                 6                               % Eosinophils               3                               % Basophils                 1                               % Immature Granulocytes     1                                Neutrophils                 5.79                            Absolute  Lymphocyte Co*     2.59                            Monocytes                   0.54                            Absolute Eosinophil Co*     0.28                            Basophils                   0.07                            Immature Granulocytes       0.05                            Nucleated RBC               0.00                            % Nucleated RBC             0                          -Ferritin:   Collection Time: 05/07/23  8:32 AM       Result                      Value                           Ferritin                    42                         -Folate and Vitamin B12:   Collection Time: 05/07/23  8:32 AM       Result                      Value                           Folate, SRM                 5.6 (L)                      Comment:    Reference range >5.8 ng/mL Folate deficiency <4.0 ng/mL       Vitamin B12 (Cobalamin)     172 (L)                      Comment:           Normal:  180-914 pg/mL    Indeterminate:  145-179 pg/mL        Deficient:  <145 pg/mL      -Iron Binding Capacity (w/Iron, Transferrin & Transf Sat):   Collection Time: 05/07/23  8:32 AM       Result                      Value                           Transferrin                 291                             Iron, SRM                   85                              Total Iron Binding Cap*     407                             Transferrin Saturation      21                             Jarome Lamas, MD  Departments of Medicine & Pediatrics  Division of Nephrology & Transplantation  [place] [facility]  Pager [phone]

## 2023-05-15 ENCOUNTER — Other Ambulatory Visit (HOSPITAL_BASED_OUTPATIENT_CLINIC_OR_DEPARTMENT_OTHER): Payer: Self-pay

## 2023-05-15 LAB — HLA AB SPEC. MONITOR DSA, EXTERNAL: HLA Specific Ab Monitor (Sendout), External: NEGATIVE

## 2023-05-15 NOTE — Result Encounter Note (Signed)
 Based on lab results from this date, no immunosuppression or other care plan changes indicated.    Current Meds:      Encounter Labs:  Results for orders placed or performed in visit on 05/15/23  -HLA AB SPEC. MONITOR DSA, EXTERNAL:   Collection Time: 05/07/23 12:00 AM       Result                      Value                           HLA Specific Ab Monito*     NEG, SEE SCANNED DOCUMENT       Performing Laboratory,*     [facility]                     Jarome Lamas, MD  Departments of Medicine & Pediatrics  Division of Nephrology & Transplantation  [place] [facility]  Pager [phone]

## 2023-05-20 ENCOUNTER — Encounter (HOSPITAL_BASED_OUTPATIENT_CLINIC_OR_DEPARTMENT_OTHER): Payer: Self-pay

## 2023-05-20 NOTE — Progress Notes (Signed)
 Reviewed clinic 05/07/23 blue sheet     RTC  -  1 Year    Return Appt Labs: Kidney, TAC, UA Pr/Cr w/ reflex UCX, DSA (protocol)    Labs - No  interim labs    Immunosuppression:   Tacrolimus  1mg /1mg  Goal 4-6  Myfortic  1000mg  BID    [facility] Immuno Updated

## 7363-05-06 DEATH — deceased
